# Patient Record
Sex: Female | Born: 1940 | Race: White | Hispanic: No | Marital: Married | Smoking: Current every day smoker
Health system: Southern US, Community
[De-identification: ages and names within clinical notes are randomized; demographics above are authoritative.]

## PROBLEM LIST (undated history)

## (undated) DIAGNOSIS — F419 Anxiety disorder, unspecified: Secondary | ICD-10-CM

## (undated) DIAGNOSIS — J449 Chronic obstructive pulmonary disease, unspecified: Secondary | ICD-10-CM

## (undated) DIAGNOSIS — R739 Hyperglycemia, unspecified: Secondary | ICD-10-CM

## (undated) DIAGNOSIS — F329 Major depressive disorder, single episode, unspecified: Secondary | ICD-10-CM

## (undated) DIAGNOSIS — E785 Hyperlipidemia, unspecified: Secondary | ICD-10-CM

## (undated) DIAGNOSIS — M5136 Other intervertebral disc degeneration, lumbar region: Secondary | ICD-10-CM

## (undated) DIAGNOSIS — M51369 Other intervertebral disc degeneration, lumbar region without mention of lumbar back pain or lower extremity pain: Secondary | ICD-10-CM

## (undated) DIAGNOSIS — T7840XA Allergy, unspecified, initial encounter: Secondary | ICD-10-CM

## (undated) DIAGNOSIS — K219 Gastro-esophageal reflux disease without esophagitis: Secondary | ICD-10-CM

## (undated) DIAGNOSIS — I10 Essential (primary) hypertension: Secondary | ICD-10-CM

## (undated) DIAGNOSIS — F32A Depression, unspecified: Secondary | ICD-10-CM

## (undated) HISTORY — DX: Other intervertebral disc degeneration, lumbar region: M51.36

## (undated) HISTORY — DX: Allergy, unspecified, initial encounter: T78.40XA

## (undated) HISTORY — DX: Gastro-esophageal reflux disease without esophagitis: K21.9

## (undated) HISTORY — DX: Anxiety disorder, unspecified: F41.9

## (undated) HISTORY — DX: Depression, unspecified: F32.A

## (undated) HISTORY — DX: Hyperlipidemia, unspecified: E78.5

## (undated) HISTORY — DX: Major depressive disorder, single episode, unspecified: F32.9

## (undated) HISTORY — PX: CATARACT EXTRACTION: SUR2

## (undated) HISTORY — DX: Hyperglycemia, unspecified: R73.9

## (undated) HISTORY — DX: Essential (primary) hypertension: I10

## (undated) HISTORY — DX: Other intervertebral disc degeneration, lumbar region without mention of lumbar back pain or lower extremity pain: M51.369

---

## 2001-04-16 ENCOUNTER — Encounter (INDEPENDENT_AMBULATORY_CARE_PROVIDER_SITE_OTHER): Payer: Self-pay | Admitting: Specialist

## 2001-04-16 ENCOUNTER — Other Ambulatory Visit: Admission: RE | Admit: 2001-04-16 | Discharge: 2001-04-16 | Payer: Self-pay | Admitting: Obstetrics and Gynecology

## 2002-08-19 ENCOUNTER — Encounter: Admission: RE | Admit: 2002-08-19 | Discharge: 2002-08-19 | Payer: Self-pay | Admitting: Orthopedic Surgery

## 2002-08-19 ENCOUNTER — Encounter: Payer: Self-pay | Admitting: Orthopedic Surgery

## 2002-09-03 ENCOUNTER — Encounter: Payer: Self-pay | Admitting: Orthopedic Surgery

## 2002-09-03 ENCOUNTER — Encounter: Admission: RE | Admit: 2002-09-03 | Discharge: 2002-09-03 | Payer: Self-pay | Admitting: Orthopedic Surgery

## 2003-10-26 ENCOUNTER — Encounter: Payer: Self-pay | Admitting: Family Medicine

## 2003-10-26 LAB — CONVERTED CEMR LAB

## 2005-03-14 ENCOUNTER — Ambulatory Visit: Payer: Self-pay | Admitting: Family Medicine

## 2005-11-14 ENCOUNTER — Ambulatory Visit: Payer: Self-pay | Admitting: Family Medicine

## 2005-11-21 ENCOUNTER — Ambulatory Visit: Payer: Self-pay | Admitting: Professional

## 2005-11-28 ENCOUNTER — Ambulatory Visit: Payer: Self-pay | Admitting: Professional

## 2005-12-12 ENCOUNTER — Ambulatory Visit: Payer: Self-pay | Admitting: Family Medicine

## 2006-02-21 ENCOUNTER — Ambulatory Visit: Payer: Self-pay | Admitting: Family Medicine

## 2006-02-28 ENCOUNTER — Ambulatory Visit: Payer: Self-pay | Admitting: Family Medicine

## 2006-05-29 ENCOUNTER — Ambulatory Visit: Payer: Self-pay | Admitting: Family Medicine

## 2006-05-29 LAB — CONVERTED CEMR LAB: Hgb A1c MFr Bld: 5.8 %

## 2006-09-06 ENCOUNTER — Ambulatory Visit: Payer: Self-pay | Admitting: Family Medicine

## 2006-11-02 ENCOUNTER — Ambulatory Visit: Payer: Self-pay | Admitting: Family Medicine

## 2007-02-21 ENCOUNTER — Ambulatory Visit: Payer: Self-pay | Admitting: Family Medicine

## 2007-04-10 ENCOUNTER — Encounter: Payer: Self-pay | Admitting: Family Medicine

## 2007-04-10 DIAGNOSIS — K219 Gastro-esophageal reflux disease without esophagitis: Secondary | ICD-10-CM

## 2007-04-10 DIAGNOSIS — F411 Generalized anxiety disorder: Secondary | ICD-10-CM

## 2007-04-10 DIAGNOSIS — F329 Major depressive disorder, single episode, unspecified: Secondary | ICD-10-CM

## 2007-04-10 DIAGNOSIS — F17200 Nicotine dependence, unspecified, uncomplicated: Secondary | ICD-10-CM | POA: Insufficient documentation

## 2007-04-10 DIAGNOSIS — I10 Essential (primary) hypertension: Secondary | ICD-10-CM

## 2007-04-10 DIAGNOSIS — E785 Hyperlipidemia, unspecified: Secondary | ICD-10-CM | POA: Insufficient documentation

## 2007-04-10 DIAGNOSIS — M5137 Other intervertebral disc degeneration, lumbosacral region: Secondary | ICD-10-CM

## 2007-04-23 ENCOUNTER — Ambulatory Visit: Payer: Self-pay | Admitting: Family Medicine

## 2007-04-25 LAB — CONVERTED CEMR LAB
ALT: 19 units/L (ref 0–40)
AST: 22 units/L (ref 0–37)
Albumin: 3.6 g/dL (ref 3.5–5.2)
BUN: 12 mg/dL (ref 6–23)
CO2: 32 meq/L (ref 19–32)
Calcium: 9 mg/dL (ref 8.4–10.5)
Chloride: 103 meq/L (ref 96–112)
Cholesterol: 212 mg/dL (ref 0–200)
Creatinine, Ser: 0.9 mg/dL (ref 0.4–1.2)
Direct LDL: 142.7 mg/dL
GFR calc Af Amer: 80 mL/min
GFR calc non Af Amer: 66 mL/min
Glucose, Bld: 99 mg/dL (ref 70–99)
HCT: 44 % (ref 36.0–46.0)
HDL: 39.7 mg/dL (ref >39.0)
Hemoglobin: 14.4 g/dL (ref 12.0–15.0)
Hgb A1c MFr Bld: 6.3 % — ABNORMAL HIGH (ref 4.6–6.0)
MCHC: 32.8 g/dL (ref 30.0–36.0)
MCV: 84.6 fL (ref 78.0–100.0)
Phosphorus: 3.4 mg/dL (ref 2.3–4.6)
Platelets: 272 10*3/uL (ref 150–400)
Potassium: 4.1 meq/L (ref 3.5–5.1)
RBC: 5.21 M/uL — ABNORMAL HIGH (ref 3.87–5.11)
RDW: 14.2 % (ref 11.5–14.6)
Sodium: 139 meq/L (ref 135–145)
Total CHOL/HDL Ratio: 5.3
Triglycerides: 105 mg/dL (ref 0–149)
VLDL: 21 mg/dL (ref 0–40)
WBC: 6.8 10*3/uL (ref 4.5–10.5)

## 2007-05-14 ENCOUNTER — Ambulatory Visit: Payer: Self-pay | Admitting: Family Medicine

## 2007-06-13 ENCOUNTER — Encounter: Payer: Self-pay | Admitting: Family Medicine

## 2007-07-31 ENCOUNTER — Ambulatory Visit: Payer: Self-pay | Admitting: Family Medicine

## 2007-08-02 LAB — CONVERTED CEMR LAB
ALT: 18 units/L (ref 0–35)
AST: 21 units/L (ref 0–37)
Cholesterol: 176 mg/dL (ref 0–200)
Creatinine,U: 82.9 mg/dL
HDL: 35.8 mg/dL — ABNORMAL LOW (ref >39.0)
Hgb A1c MFr Bld: 6 % (ref 4.6–6.0)
LDL Cholesterol: 125 mg/dL — ABNORMAL HIGH (ref 0–99)
Microalb Creat Ratio: 2.4 mg/g (ref 0.0–30.0)
Microalb, Ur: 0.2 mg/dL (ref 0.0–1.9)
Total CHOL/HDL Ratio: 4.9
Triglycerides: 77 mg/dL (ref 0–149)
VLDL: 15 mg/dL (ref 0–40)

## 2007-09-20 ENCOUNTER — Ambulatory Visit: Payer: Self-pay | Admitting: Family Medicine

## 2007-09-24 ENCOUNTER — Ambulatory Visit: Payer: Self-pay | Admitting: Family Medicine

## 2007-10-28 ENCOUNTER — Ambulatory Visit: Payer: Self-pay | Admitting: Family Medicine

## 2007-11-08 ENCOUNTER — Ambulatory Visit: Payer: Self-pay | Admitting: Family Medicine

## 2008-01-07 ENCOUNTER — Ambulatory Visit: Payer: Self-pay | Admitting: Family Medicine

## 2008-01-08 ENCOUNTER — Telehealth: Payer: Self-pay | Admitting: Family Medicine

## 2008-01-13 LAB — CONVERTED CEMR LAB
ALT: 17 units/L (ref 0–35)
AST: 21 units/L (ref 0–37)
Albumin: 3.6 g/dL (ref 3.5–5.2)
BUN: 13 mg/dL (ref 6–23)
CO2: 33 meq/L — ABNORMAL HIGH (ref 19–32)
Calcium: 9.1 mg/dL (ref 8.4–10.5)
Chloride: 100 meq/L (ref 96–112)
Cholesterol: 197 mg/dL (ref 0–200)
Creatinine, Ser: 0.9 mg/dL (ref 0.4–1.2)
GFR calc Af Amer: 80 mL/min
GFR calc non Af Amer: 66 mL/min
Glucose, Bld: 112 mg/dL — ABNORMAL HIGH (ref 70–99)
HDL: 39 mg/dL (ref >39.0)
Hgb A1c MFr Bld: 6.3 % — ABNORMAL HIGH (ref 4.6–6.0)
LDL Cholesterol: 143 mg/dL — ABNORMAL HIGH (ref 0–99)
Phosphorus: 3.5 mg/dL (ref 2.3–4.6)
Potassium: 4.1 meq/L (ref 3.5–5.1)
Sodium: 139 meq/L (ref 135–145)
Total CHOL/HDL Ratio: 5.1
Triglycerides: 75 mg/dL (ref 0–149)
VLDL: 15 mg/dL (ref 0–40)

## 2008-05-07 ENCOUNTER — Ambulatory Visit: Payer: Self-pay | Admitting: Family Medicine

## 2008-10-12 ENCOUNTER — Telehealth: Payer: Self-pay | Admitting: Family Medicine

## 2008-10-21 ENCOUNTER — Ambulatory Visit: Payer: Self-pay | Admitting: Family Medicine

## 2008-10-22 LAB — CONVERTED CEMR LAB
ALT: 11 units/L (ref 0–35)
AST: 17 units/L (ref 0–37)
Albumin: 3.7 g/dL (ref 3.5–5.2)
Alkaline Phosphatase: 51 units/L (ref 39–117)
BUN: 11 mg/dL (ref 6–23)
Basophils Absolute: 0 10*3/uL (ref 0.0–0.1)
Basophils Relative: 0.2 % (ref 0.0–3.0)
Bilirubin, Direct: 0.1 mg/dL (ref 0.0–0.3)
CO2: 30 meq/L (ref 19–32)
Calcium: 8.7 mg/dL (ref 8.4–10.5)
Chloride: 100 meq/L (ref 96–112)
Cholesterol: 182 mg/dL (ref 0–200)
Creatinine, Ser: 0.8 mg/dL (ref 0.4–1.2)
Eosinophils Absolute: 0 10*3/uL (ref 0.0–0.7)
Eosinophils Relative: 0.2 % (ref 0.0–5.0)
GFR calc Af Amer: 92 mL/min
GFR calc non Af Amer: 76 mL/min
Glucose, Bld: 119 mg/dL — ABNORMAL HIGH (ref 70–99)
HCT: 43.9 % (ref 36.0–46.0)
HDL: 37.1 mg/dL — ABNORMAL LOW (ref >39.0)
Hemoglobin: 14.9 g/dL (ref 12.0–15.0)
Hgb A1c MFr Bld: 6.3 % — ABNORMAL HIGH (ref 4.6–6.0)
LDL Cholesterol: 123 mg/dL — ABNORMAL HIGH (ref 0–99)
Lymphocytes Relative: 13.3 % (ref 12.0–46.0)
MCHC: 34 g/dL (ref 30.0–36.0)
MCV: 85.9 fL (ref 78.0–100.0)
Monocytes Absolute: 0.2 10*3/uL (ref 0.1–1.0)
Monocytes Relative: 2.5 % — ABNORMAL LOW (ref 3.0–12.0)
Neutro Abs: 8.5 10*3/uL — ABNORMAL HIGH (ref 1.4–7.7)
Neutrophils Relative %: 83.8 % — ABNORMAL HIGH (ref 43.0–77.0)
Phosphorus: 3.1 mg/dL (ref 2.3–4.6)
Platelets: 265 10*3/uL (ref 150–400)
Potassium: 3.5 meq/L (ref 3.5–5.1)
RBC: 5.11 M/uL (ref 3.87–5.11)
RDW: 14.3 % (ref 11.5–14.6)
Sodium: 137 meq/L (ref 135–145)
TSH: 0.72 microintl units/mL (ref 0.35–5.50)
Total Bilirubin: 0.7 mg/dL (ref 0.3–1.2)
Total CHOL/HDL Ratio: 4.9
Total Protein: 6.9 g/dL (ref 6.0–8.3)
Triglycerides: 109 mg/dL (ref 0–149)
VLDL: 22 mg/dL (ref 0–40)
WBC: 10 10*3/uL (ref 4.5–10.5)

## 2008-10-30 ENCOUNTER — Telehealth (INDEPENDENT_AMBULATORY_CARE_PROVIDER_SITE_OTHER): Payer: Self-pay | Admitting: *Deleted

## 2008-11-11 ENCOUNTER — Telehealth: Payer: Self-pay | Admitting: Family Medicine

## 2008-11-12 ENCOUNTER — Telehealth: Payer: Self-pay | Admitting: Family Medicine

## 2008-12-01 ENCOUNTER — Ambulatory Visit: Payer: Self-pay | Admitting: Family Medicine

## 2009-03-03 ENCOUNTER — Ambulatory Visit: Payer: Self-pay | Admitting: Family Medicine

## 2009-03-04 LAB — CONVERTED CEMR LAB
ALT: 13 units/L (ref 0–35)
AST: 15 units/L (ref 0–37)
Albumin: 3.3 g/dL — ABNORMAL LOW (ref 3.5–5.2)
BUN: 15 mg/dL (ref 6–23)
CO2: 31 meq/L (ref 19–32)
Calcium: 8.9 mg/dL (ref 8.4–10.5)
Chloride: 101 meq/L (ref 96–112)
Cholesterol: 215 mg/dL (ref 0–200)
Creatinine, Ser: 0.9 mg/dL (ref 0.4–1.2)
Direct LDL: 164 mg/dL
GFR calc Af Amer: 80 mL/min
GFR calc non Af Amer: 66 mL/min
Glucose, Bld: 99 mg/dL (ref 70–99)
HDL: 33.2 mg/dL — ABNORMAL LOW (ref >39.0)
Hgb A1c MFr Bld: 6.4 % — ABNORMAL HIGH (ref 4.6–6.0)
Phosphorus: 3.3 mg/dL (ref 2.3–4.6)
Potassium: 4.2 meq/L (ref 3.5–5.1)
Sodium: 138 meq/L (ref 135–145)
Total CHOL/HDL Ratio: 6.5
Triglycerides: 130 mg/dL (ref 0–149)
VLDL: 26 mg/dL (ref 0–40)

## 2009-03-10 ENCOUNTER — Ambulatory Visit: Payer: Self-pay | Admitting: Family Medicine

## 2009-09-23 ENCOUNTER — Ambulatory Visit: Payer: Self-pay | Admitting: Family Medicine

## 2009-10-12 ENCOUNTER — Ambulatory Visit: Payer: Self-pay | Admitting: Family Medicine

## 2009-10-15 LAB — CONVERTED CEMR LAB
ALT: 15 units/L (ref 0–35)
AST: 19 units/L (ref 0–37)
Cholesterol: 204 mg/dL — ABNORMAL HIGH (ref 0–200)
Direct LDL: 152.8 mg/dL
HDL: 32.7 mg/dL — ABNORMAL LOW (ref >39.00)
Hgb A1c MFr Bld: 6.3 % (ref 4.6–6.5)
Total CHOL/HDL Ratio: 6
Triglycerides: 133 mg/dL (ref 0.0–149.0)
VLDL: 26.6 mg/dL (ref 0.0–40.0)

## 2009-11-30 ENCOUNTER — Ambulatory Visit: Payer: Self-pay | Admitting: Family Medicine

## 2009-11-30 LAB — CONVERTED CEMR LAB: AST: 21 units/L (ref 0–37)

## 2009-12-02 LAB — CONVERTED CEMR LAB
Cholesterol: 141 mg/dL (ref 0–200)
LDL Cholesterol: 85 mg/dL (ref 0–99)
Triglycerides: 94 mg/dL (ref 0.0–149.0)

## 2009-12-13 ENCOUNTER — Telehealth: Payer: Self-pay | Admitting: Family Medicine

## 2010-01-19 ENCOUNTER — Ambulatory Visit: Payer: Self-pay | Admitting: Family Medicine

## 2010-02-09 ENCOUNTER — Telehealth: Payer: Self-pay | Admitting: Family Medicine

## 2010-02-09 ENCOUNTER — Encounter: Payer: Self-pay | Admitting: Family Medicine

## 2010-03-23 ENCOUNTER — Ambulatory Visit: Payer: Self-pay | Admitting: Family Medicine

## 2010-03-24 LAB — CONVERTED CEMR LAB
AST: 19 units/L (ref 0–37)
Cholesterol: 128 mg/dL (ref 0–200)
HDL: 42.1 mg/dL (ref >39.00)
LDL Cholesterol: 63 mg/dL (ref 0–99)
Triglycerides: 117 mg/dL (ref 0.0–149.0)
VLDL: 23.4 mg/dL (ref 0.0–40.0)

## 2010-04-06 ENCOUNTER — Telehealth: Payer: Self-pay | Admitting: Family Medicine

## 2010-08-23 ENCOUNTER — Ambulatory Visit: Payer: Self-pay | Admitting: Family Medicine

## 2010-08-23 DIAGNOSIS — J309 Allergic rhinitis, unspecified: Secondary | ICD-10-CM | POA: Insufficient documentation

## 2010-08-23 DIAGNOSIS — R739 Hyperglycemia, unspecified: Secondary | ICD-10-CM | POA: Insufficient documentation

## 2010-08-24 LAB — CONVERTED CEMR LAB
ALT: 12 units/L (ref 0–35)
BUN: 19 mg/dL (ref 6–23)
Basophils Absolute: 0.1 10*3/uL (ref 0.0–0.1)
Basophils Relative: 0.5 % (ref 0.0–3.0)
Creatinine, Ser: 0.8 mg/dL (ref 0.4–1.2)
Eosinophils Absolute: 0.3 10*3/uL (ref 0.0–0.7)
GFR calc non Af Amer: 77.5 mL/min (ref >60)
Glucose, Bld: 97 mg/dL (ref 70–99)
HDL: 34.5 mg/dL — ABNORMAL LOW (ref >39.00)
Hgb A1c MFr Bld: 6.7 % — ABNORMAL HIGH (ref 4.6–6.5)
LDL Cholesterol: 100 mg/dL — ABNORMAL HIGH (ref 0–99)
Lymphocytes Relative: 23.4 % (ref 12.0–46.0)
MCHC: 33 g/dL (ref 30.0–36.0)
MCV: 85.4 fL (ref 78.0–100.0)
Monocytes Absolute: 0.7 10*3/uL (ref 0.1–1.0)
Neutro Abs: 6.6 10*3/uL (ref 1.4–7.7)
Neutrophils Relative %: 66.3 % (ref 43.0–77.0)
RBC: 5.08 M/uL (ref 3.87–5.11)
RDW: 15.8 % — ABNORMAL HIGH (ref 11.5–14.6)
Sodium: 139 meq/L (ref 135–145)
Total CHOL/HDL Ratio: 4
Triglycerides: 80 mg/dL (ref 0.0–149.0)
VLDL: 16 mg/dL (ref 0.0–40.0)

## 2010-09-13 ENCOUNTER — Telehealth: Payer: Self-pay | Admitting: Family Medicine

## 2010-10-12 ENCOUNTER — Telehealth: Payer: Self-pay | Admitting: Family Medicine

## 2010-11-23 ENCOUNTER — Telehealth: Payer: Self-pay | Admitting: Family Medicine

## 2010-12-06 ENCOUNTER — Ambulatory Visit: Payer: Self-pay | Admitting: Family Medicine

## 2010-12-06 DIAGNOSIS — M25519 Pain in unspecified shoulder: Secondary | ICD-10-CM

## 2010-12-13 ENCOUNTER — Telehealth: Payer: Self-pay | Admitting: Family Medicine

## 2011-01-12 ENCOUNTER — Ambulatory Visit
Admission: RE | Admit: 2011-01-12 | Discharge: 2011-01-12 | Payer: Self-pay | Source: Home / Self Care | Attending: Family Medicine | Admitting: Family Medicine

## 2011-01-12 DIAGNOSIS — M7500 Adhesive capsulitis of unspecified shoulder: Secondary | ICD-10-CM | POA: Insufficient documentation

## 2011-01-24 NOTE — Progress Notes (Signed)
Summary: refill request for xanax  Phone Note Refill Request Call back at [phone] Message from:  Patient  Refills Requested: Medication #1:  ALPRAZOLAM 0.5 MG TABS 1 by mouth once daily as needed severe anxiety Phoned request from pt, please call to [facility] [address].  Initial call taken by: Lowella Petties CMA,  April 06, 2010 4:13 PM  Follow-up for Phone Call        px written on EMR for call in  Follow-up by: Judith Part MD,  April 06, 2010 4:53 PM  Additional Follow-up for Phone Call Additional follow up Details #1::        Patient's hsuband notified as instructed by telephone.  Medication phoned to [facility] as instructed. Lewanda Rife LPN  April 06, 2010 5:03 PM     New/Updated Medications: ALPRAZOLAM 0.5 MG TABS (ALPRAZOLAM) 1 by mouth once daily as needed severe anxiety Prescriptions: ALPRAZOLAM 0.5 MG TABS (ALPRAZOLAM) 1 by mouth once daily as needed severe anxiety  #30 x 0   Entered and Authorized by:   Judith Part MD   Signed by:   Lewanda Rife LPN on 45/40/9811   Method used:   Telephoned to ...         RxID:   [id number]

## 2011-01-24 NOTE — Miscellaneous (Signed)
Summary: Controlled Substance Agreement  Controlled Substance Agreement   Imported By: Lanelle Bal 09/02/2010 13:45:25  _____________________________________________________________________  External Attachment:    Type:   Image     Comment:   External Document

## 2011-01-24 NOTE — Progress Notes (Signed)
Summary: alprazolam   Phone Note Refill Request Message from:  Fax from Pharmacy on November 23, 2010 10:33 AM  Refills Requested: Medication #1:  ALPRAZOLAM 0.5 MG TABS 1 by mouth once daily as needed severe anxiety Refill request from [facility] [address]. [phone].   Initial call taken by: Melody Comas,  November 23, 2010 10:34 AM  Follow-up for Phone Call        px written on EMR for call in  Follow-up by: Judith Part MD,  November 23, 2010 12:56 PM  Additional Follow-up for Phone Call Additional follow up Details #1::        Rx called in as directed Additional Follow-up by: Janee Morn CMA Duncan Dull),  November 23, 2010 2:13 PM    Prescriptions: ALPRAZOLAM 0.5 MG TABS (ALPRAZOLAM) 1 by mouth once daily as needed severe anxiety  #30 x 1   Entered and Authorized by:   Judith Part MD   Signed by:   Selena Batten Dance CMA (AAMA) on 11/23/2010   Method used:   Telephoned to ...       [facility]       [address]       [place], [place]  [address]       Ph: [phone]       Fax: [phone]   RxID:   [id number]

## 2011-01-24 NOTE — Progress Notes (Signed)
Summary: form for diabetic supplies  Phone Note From Pharmacy   Caller: [facility] Summary of Call: Form for diabetic supplies is on your shelf. Initial call taken by: Lowella Petties CMA,  February 09, 2010 8:29 AM  Follow-up for Phone Call        form done and in nurse in box  Follow-up by: Judith Part MD,  February 09, 2010 6:05 PM  Additional Follow-up for Phone Call Additional follow up Details #1::        Completed form faxed to [phone] as instructed.Lewanda Rife LPN  February 10, 2010 9:12 AM

## 2011-01-24 NOTE — Assessment & Plan Note (Signed)
Summary: 3 months follow up /lsf   Vital Signs:  Patient profile:   70 year old female Weight:      141 pounds BMI:     23.19 Temp:     97.5 degrees F oral Pulse rate:   72 / minute Pulse rhythm:   regular BP sitting:   130 / 62  (left arm) Cuff size:   regular  Vitals Entered By: Lowella Petties CMA (January 19, 2010 1:59 PM) CC: 3 month follow up   History of Present Illness: here for f/u of HTN and lipids and DM  is doing ok overall  no luck on the job search- really wants to go back to work    wt is up 2 lb with bmi of 23  bp 130/62 today and has been well controlled   last lipids -- LDL 85 in december on zocor  is really pleased with that  is doing well with her diet low sugar and low fat   is doing some exercise at home -- some calesthenics    sugar control was good in oct 6.3 AIC  opthy -- is up to date - went 3 months ago - was ok -- dr Abran Duke  has f/u in 6 mo (no DM eye disease)  anxiety is overall much better - stable on her current medicine -- does not use alprazolam  often  still takes paxil - and that works welll   smoking 6 cigarettes per day- wants to start patch  Allergies: No Known Drug Allergies  Past History:  Past Medical History: Last updated: 10/21/2008  HYPERTENSION, BENIGN (ICD-401.1) ADVEF, DRUG/MEDICINAL/BIOLOGICAL SUBST NOS (ICD-995.20) BREAST CANCER, FAMILY HX (ICD-V16.3) Hx of DEGENERATIVE DISC DISEASE, LUMBOSACRAL SPINE (ICD-722.52) Hx of SMOKER (ICD-305.1) HYPERTENSION (ICD-401.9) HYPERLIPIDEMIA (ICD-272.4) GERD (ICD-530.81) DIABETES MELLITUS, TYPE II (ICD-250.00) DEPRESSION (ICD-311) ANXIETY (ICD-300.00)    Past Surgical History: Last updated: 04/10/2007 cataract sx  Family History: Last updated: 04/10/2007 mother breast ca sister breast ca sister lung ca  Social History: Last updated: 10/21/2008 Patient currently smokes.  no alcohol lost job 2009  Risk Factors: Smoking Status: current  (04/10/2007)  Review of Systems General:  Denies fatigue, fever, loss of appetite, and malaise. Eyes:  Denies blurring and eye irritation. CV:  Denies chest pain or discomfort, lightheadness, palpitations, and shortness of breath with exertion. Resp:  Denies cough, shortness of breath, and wheezing. GI:  Denies abdominal pain, change in bowel habits, and indigestion. MS:  Denies joint pain. Derm:  Denies lesion(s), poor wound healing, and rash. Neuro:  Denies headaches, numbness, and tingling. Psych:  Denies panic attacks, sense of great danger, and suicidal thoughts/plans. Endo:  Denies excessive thirst and excessive urination. Heme:  Denies abnormal bruising and bleeding.  Physical Exam  General:  Well-developed,well-nourished,in no acute distress; alert,appropriate and cooperative throughout examination Head:  normocephalic, atraumatic, and no abnormalities observed.   Eyes:  vision grossly intact, pupils equal, pupils round, and pupils reactive to light.   Mouth:  pharynx pink and moist.   Neck:  supple with full rom and no masses or thyromegally, no JVD or carotid bruit  Lungs:  CTA with distant bs harsh at bases- few isolated rhonchi no rales or wheeze nl exp phase Heart:  Normal rate and regular rhythm. S1 and S2 normal without gallop, murmur, click, rub or other extra sounds. Pulses:  R and L carotid,radial,femoral,dorsalis pedis and posterior tibial pulses are full and equal bilaterally Extremities:  No clubbing, cyanosis, edema, or deformity noted with  normal full range of motion of all joints.   Neurologic:  sensation intact to light touch, gait normal, and DTRs symmetrical and normal.   Skin:  Intact without suspicious lesions or rashes Cervical Nodes:  No lymphadenopathy noted Psych:  normal affect, talkative and pleasant  not anxious  Diabetes Management Exam:    Foot Exam (with socks and/or shoes not present):       Sensory-Pinprick/Light touch:          Left  medial foot (L-4): normal          Left dorsal foot (L-5): normal          Left lateral foot (S-1): normal          Right medial foot (L-4): normal          Right dorsal foot (L-5): normal          Right lateral foot (S-1): normal       Sensory-Monofilament:          Left foot: normal          Right foot: normal       Inspection:          Left foot: normal          Right foot: normal       Nails:          Left foot: normal          Right foot: normal    Eye Exam:       Eye Exam done elsewhere          Date: 09/24/2009          Results: normal          Done by: Dr Hazle Quant   Impression & Recommendations:  Problem # 1:  HYPERTENSION, BENIGN (ICD-401.1) Assessment Unchanged  good control without change enc starting 20 min exercise daily f/u 6 mo  Her updated medication list for this problem includes:    Zestril 10 Mg Tabs (Lisinopril) .Marland Kitchen... 1 by mouth once daily  BP today: 130/62 Prior BP: 90/58 (09/23/2009)  Labs Reviewed: K+: 4.2 (03/03/2009) Creat: : 0.9 (03/03/2009)   Chol: 141 (11/30/2009)   HDL: 37.70 (11/30/2009)   LDL: 85 (11/30/2009)   TG: 94.0 (11/30/2009)  Problem # 2:  Hx of SMOKER (ICD-305.1) Assessment: Improved encouraged to move foward with plan to quit / patches  has cut down a lot also - is motivated   Problem # 3:  HYPERLIPIDEMIA (ICD-272.4) Assessment: Improved  improved but not to goal of LDL under 70 increase zocor to 40 - update if side eff lab 6 weeks f/u 6 mo  rev low sat fat diet  Her updated medication list for this problem includes:    Zocor 40 Mg Tabs (Simvastatin) .Marland Kitchen... 1 by mouth once daily  Labs Reviewed: SGOT: 21 (11/30/2009)   SGPT: 13 (11/30/2009)   HDL:37.70 (11/30/2009), 32.70 (10/12/2009)  LDL:85 (11/30/2009), DEL (16/09/9603)  Chol:141 (11/30/2009), 204 (10/12/2009)  Trig:94.0 (11/30/2009), 133.0 (10/12/2009)  Problem # 4:  DIABETES MELLITUS, TYPE II (ICD-250.00) Assessment: Unchanged  AIC has been ok with diet control  alone disc healthy diet (low simple sugar/ choose complex carbs/ low sat fat) diet and exercise in detail  up to date opthy and workign on smoking cessation will checkAIC in 6 wk and f/u 6 mo  Her updated medication list for this problem includes:    Zestril 10 Mg Tabs (Lisinopril) .Marland Kitchen... 1 by mouth once daily  Aspirin Adult Low Strength 81 Mg Tbec (Aspirin) .Marland Kitchen... 1 by mouth once daily  Labs Reviewed: Creat: 0.9 (03/03/2009)     Last Eye Exam: normal (09/24/2008) Reviewed HgBA1c results: 6.3 (10/12/2009)  6.4 (03/03/2009)  Problem # 5:  ANXIETY (ICD-300.00) Assessment: Unchanged stable on current med without change  recommended exercise regularly The following medications were removed from the medication list:    Paxil 40 Mg Tabs (Paroxetine hcl) .Marland Kitchen... 1 by mouth once daily Her updated medication list for this problem includes:    Alprazolam 0.5 Mg Tabs (Alprazolam) .Marland Kitchen... 1 by mouth once daily as needed severe anxiety    Paxil 40 Mg Tabs (Paroxetine hcl) .Marland Kitchen... 1 by mouth once daily  Complete Medication List: 1)  Glucometer  .... To check glucose once daily and as needed for diabetes (250.0) 2)  Glucose Test Strips and Lancets  .... To check glucose once daily and as needed for diabetes type 2 3)  Zestril 10 Mg Tabs (Lisinopril) .Marland Kitchen.. 1 by mouth once daily 4)  Alprazolam 0.5 Mg Tabs (Alprazolam) .Marland Kitchen.. 1 by mouth once daily as needed severe anxiety 5)  Zocor 40 Mg Tabs (Simvastatin) .Marland Kitchen.. 1 by mouth once daily 6)  Aspirin Adult Low Strength 81 Mg Tbec (Aspirin) .Marland Kitchen.. 1 by mouth once daily 7)  Paxil 40 Mg Tabs (Paroxetine hcl) .Marland Kitchen.. 1 by mouth once daily  Patient Instructions: 1)  It is important that you exercise reguarly at least 20 minutes 5 times a week. If you develop chest pain, have severe difficulty breathing, or feel very tired, stop exercising immediately and seek medical attention.  2)  try exercise video or walking around house or mall  3)  increase your zocor to 40 mg daily  (double up on the 20s until you get new px)  4)  schedule fasting lab in 6 weeks lipid/ast/alt 272, AIC 250.0. 272 5)  schedule follow up with me in about 6 months  Prescriptions: ZOCOR 40 MG TABS (SIMVASTATIN) 1 by mouth once daily  #30 x 11   Entered and Authorized by:   Judith Part MD   Signed by:   Judith Part MD on 01/19/2010   Method used:   Print then Give to Patient   RxID:   [id number]   Prior Medications (reviewed today): GLUCOMETER () to check glucose once daily and as needed for diabetes (250.0) GLUCOSE TEST STRIPS AND LANCETS () to check glucose once daily and as needed for diabetes type 2 ZESTRIL 10 MG TABS (LISINOPRIL) 1 by mouth once daily ALPRAZOLAM 0.5 MG TABS (ALPRAZOLAM) 1 by mouth once daily as needed severe anxiety ASPIRIN ADULT LOW STRENGTH 81 MG TBEC (ASPIRIN) 1 by mouth once daily Current Allergies: No known allergies

## 2011-01-24 NOTE — Progress Notes (Signed)
Summary: refill request for alprazolam  Phone Note Refill Request Call back at Home Phone [phone] Message from:  Patient  Refills Requested: Medication #1:  ALPRAZOLAM 0.5 MG TABS 1 by mouth once daily as needed severe anxiety Pt uses [facility] [address].  She is also complaining of nausea with headaches and she is asking if she can have some phenergan.  No vomiting, just occasional nausea with headaches and stress.  Initial call taken by: Lowella Petties CMA,  October 12, 2010 10:01 AM  Follow-up for Phone Call        px written on EMR for call in f/u if other symptoms worsen Follow-up by: Judith Part MD,  October 12, 2010 10:14 AM  Additional Follow-up for Phone Call Additional follow up Details #1::        Patient notified as instructed by telephone. [facility] [address] [facility] as instructed. Lewanda Rife LPN  October 12, 2010 10:59 AM     New/Updated Medications: PROMETHAZINE HCL 25 MG TABS (PROMETHAZINE HCL) 1 by mouth up to three times a day as needed nausea caution may sedate Prescriptions: PROMETHAZINE HCL 25 MG TABS (PROMETHAZINE HCL) 1 by mouth up to three times a day as needed nausea caution may sedate  #20 x 0   Entered and Authorized by:   Judith Part MD   Signed by:   Lewanda Rife LPN on 09/81/1914   Method used:   Telephoned to ...         RxID:   [id number] ALPRAZOLAM 0.5 MG TABS (ALPRAZOLAM) 1 by mouth once daily as needed severe anxiety  #30 x 0   Entered and Authorized by:   [place]ith Part MD   Signed by:   Lewanda Rife LPN on 69/62/9528   Method used:   Telephoned to ...         RxID:   [id number]

## 2011-01-24 NOTE — Progress Notes (Signed)
Summary: refill request for alprazolam  Phone Note Refill Request Message from:  Fax from Pharmacy  Refills Requested: Medication #1:  ALPRAZOLAM 0.5 MG TABS 1 by mouth once daily as needed severe anxiety   Last Refilled: 04/06/2010 Faxed request from [facility] [address], [phone].  Initial call taken by: Lowella Petties CMA,  September 13, 2010 10:39 AM  Follow-up for Phone Call        px written on EMR for call in  Follow-up by: Judith Part MD,  September 13, 2010 12:47 PM  Additional Follow-up for Phone Call Additional follow up Details #1::        [facility] as instructed. Lewanda Rife LPN  September 13, 2010 12:56 PM     Prescriptions: ALPRAZOLAM 0.5 MG TABS (ALPRAZOLAM) 1 by mouth once daily as needed severe anxiety  #30 x 0   Entered and Authorized by:   Judith Part MD   Signed by:   Lewanda Rife LPN on 66/44/0347   Method used:   Telephoned to ...         RxID:   [id number]

## 2011-01-24 NOTE — Medication Information (Signed)
Summary: Diabetes Supplies/[facility]  Diabetes Supplies/[facility]   Imported By: Lanelle Bal 02/12/2010 11:48:57  _____________________________________________________________________  External Attachment:    Type:   Image     Comment:   External Document

## 2011-01-24 NOTE — Assessment & Plan Note (Signed)
Summary: F/U DLO   Vital Signs:  Patient profile:   70 year old female Height:      65.5 inches Weight:      139.75 pounds BMI:     22.98 Temp:     98.8 degrees F oral Pulse rate:   88 / minute Pulse rhythm:   regular BP sitting:   104 / 64  (left arm) Cuff size:   regular  Vitals Entered By: Lewanda Rife LPN (August 23, 2010 9:15 AM) CC: follow up visit   History of Present Illness: here for f/u of HTN and DM and smoking and anxiety  has been doing well overall -- some sinus congestion  takes tylenol over the counter  stuffy at night    last lipids in spring improved with inc zocor to 40 mg   DM was good last check 6.3 AIC - actually qualifies for hyperglycemia  this is diet controlled  opthy utd  on ace  is still doing well with her diet  some exercise several days per week  has been putting up garden veg and canning   smoking -- still smoking  was going to start patches -- and never did - for no reason  is ready to do it now   wt is down 2 lb  HTN in good control with 104/64 today  anxiety - doing very well with that  hardly ever needs xanax    Allergies (verified): No Known Drug Allergies  Past History:  Past Surgical History: Last updated: 04/10/2007 cataract sx  Family History: Last updated: 04/10/2007 mother breast ca sister breast ca sister lung ca  Social History: Last updated: 10/21/2008 Patient currently smokes.  no alcohol lost job 2009  Risk Factors: Smoking Status: current (04/10/2007)  Past Medical History:  HYPERTENSION, BENIGN (ICD-401.1) ADVEF, DRUG/MEDICINAL/BIOLOGICAL SUBST NOS (ICD-995.20) BREAST CANCER, FAMILY HX (ICD-V16.3) Hx of DEGENERATIVE DISC DISEASE, LUMBOSACRAL SPINE (ICD-722.52) Hx of SMOKER (ICD-305.1) HYPERTENSION (ICD-401.9) HYPERLIPIDEMIA (ICD-272.4) GERD (ICD-530.81) hyperglycemia - controlled by diet  DEPRESSION (ICD-311) ANXIETY (ICD-300.00) allergic rhinitis     Review of Systems General:   Denies chills, fatigue, fever, loss of appetite, and malaise. Eyes:  Denies blurring and eye irritation. ENT:  Complains of nasal congestion and postnasal drainage. CV:  Denies chest pain or discomfort, lightheadness, and palpitations. Resp:  Denies cough, shortness of breath, and wheezing. GI:  Denies abdominal pain, change in bowel habits, and indigestion. GU:  Denies dysuria and urinary frequency. MS:  Denies cramps and stiffness. Derm:  Denies itching, lesion(s), poor wound healing, and rash. Neuro:  Denies numbness and tingling. Psych:  Denies panic attacks, sense of great danger, and suicidal thoughts/plans. Endo:  Denies cold intolerance, excessive thirst, excessive urination, and heat intolerance. Heme:  Denies abnormal bruising and bleeding.  Physical Exam  General:  Well-developed,well-nourished,in no acute distress; alert,appropriate and cooperative throughout examination Head:  normocephalic, atraumatic, and no abnormalities observed.   Eyes:  vision grossly intact, pupils equal, pupils round, and pupils reactive to light.  no conjunctival pallor, injection or icterus  Ears:  R ear normal and L ear normal.   Nose:  no nasal discharge.  - nares are boggy  Mouth:  pharynx pink and moist.   Neck:  supple with full rom and no masses or thyromegally, no JVD or carotid bruit  Chest Wall:  No deformities, masses, or tenderness noted. Lungs:  CTA with distant bs and fair air exch no rales or wheeze Heart:  Normal rate and regular rhythm.  S1 and S2 normal without gallop, murmur, click, rub or other extra sounds. Abdomen:  Bowel sounds positive,abdomen soft and non-tender without masses, organomegaly or hernias noted. no renal bruits  Msk:  No deformity or scoliosis noted of thoracic or lumbar spine.   Pulses:  R and L carotid,radial,femoral,dorsalis pedis and posterior tibial pulses are full and equal bilaterally Extremities:  No clubbing, cyanosis, edema, or deformity noted with  normal full range of motion of all joints.   Neurologic:  sensation intact to light touch, gait normal, and DTRs symmetrical and normal.   Skin:  Intact without suspicious lesions or rashes Cervical Nodes:  No lymphadenopathy noted Psych:  normal affect, talkative and pleasant   Diabetes Management Exam:    Foot Exam (with socks and/or shoes not present):       Sensory-Pinprick/Light touch:          Left medial foot (L-4): normal          Left dorsal foot (L-5): normal          Left lateral foot (S-1): normal          Right medial foot (L-4): normal          Right dorsal foot (L-5): normal          Right lateral foot (S-1): normal       Sensory-Monofilament:          Left foot: normal          Right foot: normal       Inspection:          Left foot: normal          Right foot: normal       Nails:          Left foot: normal          Right foot: normal   Impression & Recommendations:  Problem # 1:  HYPERTENSION (ICD-401.9) Assessment Unchanged  very good control with ace  continue this and lab today Her updated medication list for this problem includes:    Zestril 10 Mg Tabs (Lisinopril) .Marland Kitchen... 1 by mouth once daily  BP today: 104/64 Prior BP: 130/62 (01/19/2010)  Labs Reviewed: K+: 4.2 (03/03/2009) Creat: : 0.9 (03/03/2009)   Chol: 128 (03/23/2010)   HDL: 42.10 (03/23/2010)   LDL: 63 (03/23/2010)   TG: 117.0 (03/23/2010)  Orders: Venipuncture (60454) TLB-Lipid Panel (80061-LIPID) TLB-Renal Function Panel (80069-RENAL) TLB-ALT (SGPT) (84460-ALT) TLB-AST (SGOT) (84450-SGOT) TLB-CBC Platelet - w/Differential (85025-CBCD) TLB-TSH (Thyroid Stimulating Hormone) (84443-TSH) TLB-A1C / Hgb A1C (Glycohemoglobin) (83036-A1C)  Problem # 2:  BREAST CANCER, FAMILY HX (ICD-V16.3) Assessment: Unchanged pt sees gyn- I asked for last mam and she does not remember where she got it  will call back with that info  Problem # 3:  Hx of SMOKER (ICD-305.1) Assessment:  Unchanged discussed in detail risks of smoking, and possible outcomes including COPD, vascular dz, cancer and also respiratory infections/sinus problems  had long discussion- unsure if pt is motivated to quit despite above enc her to get patches as planned   Problem # 4:  HYPERLIPIDEMIA (ICD-272.4) Assessment: Unchanged  this is very well controlled with higher dose zocor no side eff rev low sat fat diet - doing well with lots of veg  lab today Her updated medication list for this problem includes:    Zocor 40 Mg Tabs (Simvastatin) .Marland Kitchen... 1 by mouth once daily  Orders: Venipuncture (09811) TLB-Lipid Panel (80061-LIPID) TLB-Renal Function Panel (80069-RENAL) TLB-ALT (SGPT) (84460-ALT)  TLB-AST (SGOT) (84450-SGOT) TLB-CBC Platelet - w/Differential (85025-CBCD) TLB-TSH (Thyroid Stimulating Hormone) (84443-TSH) TLB-A1C / Hgb A1C (Glycohemoglobin) (83036-A1C)  Labs Reviewed: SGOT: 19 (03/23/2010)   SGPT: 15 (03/23/2010)   HDL:42.10 (03/23/2010), 37.70 (11/30/2009)  LDL:63 (03/23/2010), 85 (11/30/2009)  Chol:128 (03/23/2010), 141 (11/30/2009)  Trig:117.0 (03/23/2010), 94.0 (11/30/2009)  Problem # 5:  HYPERGLYCEMIA (ICD-790.29) Assessment: Unchanged  this has been in good control with last AIC 6.3 - diet and exercise lab today  qualifies for hyperglycemia instead of DM dx  disc healthy diet (low simple sugar/ choose complex carbs/ low sat fat) diet and exercise in detail  opthy up to date on ace     Labs Reviewed: Creat: 0.9 (03/03/2009)     Last Eye Exam: normal (09/24/2009) Reviewed HgBA1c results: 6.3 (03/23/2010)  6.3 (10/12/2009)  Problem # 6:  ALLERGIC RHINITIS (ICD-477.9) Assessment: Deteriorated seasonally worse- night time congestion and rhinorrhea adv to try otc antihist - see inst  nasal saline spray in eve if not imp consider nasal saline  Complete Medication List: 1)  Glucometer  .... To check glucose once daily and as needed for diabetes (250.0) 2)   Glucose Test Strips and Lancets  .... To check glucose once daily and as needed for diabetes type 2 3)  Zestril 10 Mg Tabs (Lisinopril) .Marland Kitchen.. 1 by mouth once daily 4)  Alprazolam 0.5 Mg Tabs (Alprazolam) .Marland Kitchen.. 1 by mouth once daily as needed severe anxiety 5)  Zocor 40 Mg Tabs (Simvastatin) .Marland Kitchen.. 1 by mouth once daily 6)  Aspirin Adult Low Strength 81 Mg Tbec (Aspirin) .Marland Kitchen.. 1 by mouth once daily 7)  Paxil 40 Mg Tabs (Paroxetine hcl) .Marland Kitchen.. 1 by mouth once daily  Patient Instructions: 1)  for allergies - try claritin or allegra or zyrtec over the counter 2)  also use nasal saline spray at bedtime  3)  let me know if this does not help  4)  go get patches to quit smoking - now is the time  5)  no change in medicine  6)  lab today 7)  don't forget to get your flu shot 8)  let me know when your last mammogram was when you know   Current Allergies (reviewed today): No known allergies

## 2011-01-25 ENCOUNTER — Telehealth: Payer: Self-pay | Admitting: Family Medicine

## 2011-01-26 NOTE — Progress Notes (Signed)
Summary: arms still hurting  Phone Note Call from Patient Call back at Home Phone [phone]   Caller: Patient Call For: Judith Part MD Summary of Call: Pt was seen for arm pain and was told to stop her cholesterol medicine.  She did stop the medicine but the pain is not any better.  She says she can deal with the pain- only hurts when she raises her arms or puts on a coat.  [facility] [address], if you think she should be taking an antiinflammatory. Initial call taken by: Lowella Petties CMA, AAMA,  December 13, 2010 12:58 PM  Follow-up for Phone Call        go back on the chol med since this did not help to stop it  I wonder if this could be her tendonitis coming back -- ? if cortisone shots would be an option I would like her to have appt with Dr Patsy Lager for this - please schedule if agreeable Follow-up by: Judith Part MD,  December 13, 2010 1:01 PM  Additional Follow-up for Phone Call Additional follow up Details #1::        Patient notified as instructed by telephone. Pt will start back on her cholesterol med. Pt said she will have to call back to schedule appt with Dr Patsy Lager. Pt also said she will start back on Tylenol until she can come in to see Dr Patsy Lager.Lewanda Rife LPN  December 13, 2010 4:18 PM

## 2011-01-26 NOTE — Assessment & Plan Note (Signed)
Summary: CORTISONE INJECTION IN SHOULDER   Vital Signs:  Patient profile:   70 year old female Height:      65.5 inches Weight:      143.75 pounds BMI:     23.64 Temp:     97.6 degrees F oral Pulse rate:   80 / minute Pulse rhythm:   regular BP sitting:   130 / 80  (left arm) Cuff size:   regular  Vitals Entered By: Benny Lennert CMA Duncan Dull) (January 12, 2011 10:56 AM)  History of Present Illness: Chief complaint Right injection in shoulder  70 year old here for evaluation of right shoulder pain:  for several months now, the patient has had severe pain in her shoulders, right greater than left. She is diminished range of motion bilaterally, right greater than left. She has had no incidence of any traumatic injury.  No prior traumatic fractures or operative interventions. She did have some what sounds like rotator cuff tendinitis in the left shoulder approximately 15 years ago which resulted status post subacromial injection.  She is very limited, and cannot even take care of her self, cannot wash her hair or fix her hair or is having difficulty dressing herself.  Allergies (verified): No Known Drug Allergies  Past History:  Past medical, surgical, family and social histories (including risk factors) reviewed, and no changes noted (except as noted below).  Past Medical History: Reviewed history from 08/23/2010 and no changes required.  HYPERTENSION, BENIGN (ICD-401.1) ADVEF, DRUG/MEDICINAL/BIOLOGICAL SUBST NOS (ICD-995.20) BREAST CANCER, FAMILY HX (ICD-V16.3) Hx of DEGENERATIVE DISC DISEASE, LUMBOSACRAL SPINE (ICD-722.52) Hx of SMOKER (ICD-305.1) HYPERTENSION (ICD-401.9) HYPERLIPIDEMIA (ICD-272.4) GERD (ICD-530.81) hyperglycemia - controlled by diet  DEPRESSION (ICD-311) ANXIETY (ICD-300.00) allergic rhinitis     Past Surgical History: Reviewed history from 04/10/2007 and no changes required. cataract sx  Family History: Reviewed history from 04/10/2007 and no  changes required. mother breast ca sister breast ca sister lung ca  Social History: Reviewed history from 10/21/2008 and no changes required. Patient currently smokes.  no alcohol lost job 2009  Review of Systems       REVIEW OF SYSTEMS  GEN: No systemic complaints, no fevers, chills, sweats, or other acute illnesses MSK: Detailed in the HPI GI: tolerating PO intake without difficulty Neuro: No numbness, parasthesias, or tingling associated. Otherwise the pertinent positives of the ROS are noted above.    Physical Exam  General:  GEN: Well-developed,well-nourished,in no acute distress; alert,appropriate and cooperative throughout examination HEENT: Normocephalic and atraumatic without obvious abnormalities. No apparent alopecia or balding. Ears, externally no deformities PULM: Breathing comfortably in no respiratory distress EXT: No clubbing, cyanosis, or edema PSYCH: Normally interactive. Cooperative during the interview. Pleasant. Friendly and conversant. Not anxious or depressed appearing. Normal, full affect.  Msk:  let shoulder: Nontender along clavicle and a.c. joint. Flexion and abduction both approximately 150. Lacks approximately 25 of terminal external rotation and lacks approximately 60 of internal range of motion.  Right shoulder: Nontender along clavicle. Tender at the acromioclavicular joint.  Severe restriction in all motion. Flexion and abduction to approximately 95 with significant elevation of the scapula.  Difficulty achieving 90 of abduction, but when this is achieved, there is essentially no internal or external rotation.  Crossover is positive on the right.  All other special testing is equivocal given the patient's loss of motion.  Negative drop test.  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 Emphasized that without adherence to HEP, likely will not get better.  Tylenol or NSAID of choice prn for pain relief  Patient will be sent for formal PT for aggressive frozen shoulder ROM. Will need RTC str and scapular stabilization to fix underlying mechanics. I would anticipate at least several months before the patient is having recovery of function. Full recovery of range of motion may take 6 months or greater.  intraarticular corticosteroid injections in Adhesive Capsulitis has clearly been shown to be of benefit.   Intrarticular Shoulder Injection, RIGHT Verbal consent was obtained from the patient. Risks, benefits, and alternatives were explained. Patient prepped with Betadine and Ethyl Chloride used for anesthesia. An intraarticular shoulder injection was performed using the posterior approach. The patient tolerated the procedure well and had decreased pain post injection. No complications. Injection: 9 cc of Lidocaine 1% and 1cc of Kenalog 40 mg. Needle: 22 gauge   Orders: Physical Therapy Referral (PT) Joint Aspirate / Injection, Large (20610)  Problem # 2:  ADHESIVE CAPSULITIS, LEFT (ICD-726.0) Assessment: New  Intrarticular Shoulder Injection, LEFT Verbal consent was obtained from the patient. Risks, benefits, and alternatives were explained. Patient prepped with Betadine and Ethyl Chloride used for anesthesia. An intraarticular shoulder injection was performed using the posterior approach. The patient tolerated the procedure well and had decreased pain post injection. No complications. Injection: 9 cc of Lidocaine 1% and 1cc of Kenalog 40 mg. Needle: 22 gauge   Orders: Joint Aspirate / Injection, Large (20610) Kenalog 10mg  (4units) (J3301) Kenalog 10mg  (4units) (J3301)  Complete Medication List: 1)  Glucometer  .... To check glucose once daily and as needed for  diabetes (250.0) 2)  Glucose Test Strips and Lancets  .... To check glucose once daily and as needed for diabetes type 2 3)  Zestril 10 Mg Tabs (Lisinopril) .Marland Kitchen.. 1 by mouth once daily 4)  Alprazolam 0.5 Mg Tabs (Alprazolam) .Marland Kitchen.. 1 by mouth once daily as needed severe anxiety 5)  Zocor 40 Mg Tabs (Simvastatin) .Marland Kitchen.. 1 by mouth once daily 6)  Aspirin Adult Low Strength 81 Mg Tbec (Aspirin) .Marland Kitchen.. 1 by mouth once daily 7)  Paxil 40 Mg Tabs (Paroxetine hcl) .Marland Kitchen.. 1 by mouth once daily 8)  Tylenol Extra Strength 500 Mg Tabs (Acetaminophen) .... Otc as directed.  Patient Instructions: 1)  f/u 1 month 2)  Referral Appointment Information 3)  Day/Date: 4)  Time: 5)  Place/MD: 6)  Address: 7)  Phone/Fax: 8)  Patient given appointment information. Information/Orders faxed/mailed.    Orders Added: 1)  Physical Therapy Referral [PT] 2)  Est. Patient Level IV [16109] 3)  Joint Aspirate / Injection, Large [20610] 4)  Kenalog 10mg  (4units) [J3301] 5)  Joint Aspirate / Injection, Large [20610] 6)  Kenalog 10mg  (4units) [J3301]    Current Allergies (reviewed today): No known allergies

## 2011-01-26 NOTE — Assessment & Plan Note (Signed)
Summary: PROBLEM WITH ARM/JRR   Vital Signs:  Patient profile:   70 year old female Height:      65.5 inches Weight:      142 pounds BMI:     23.35 Temp:     97.8 degrees F oral Pulse rate:   80 / minute Pulse rhythm:   regular BP sitting:   110 / 66  (left arm) Cuff size:   regular  Vitals Entered By: Lewanda Rife LPN (December 06, 2010 2:06 PM) CC: pain in shoulders and arms, especially when raises arms. Pain level when raises arm or arms is 10.   History of Present Illness: pain in shoulders and arms esp when she raises them for 1 mo  takes tylenol and it helps  R is worse than the left   no trauma  does sleep on her sides -- not now as much  now hurts arm to sleep on sides  no swelling that she can tell   this happened years ago-- tendonitis - and had to have cortisone shots  does sometimes hurt in joints and sometimes muscles     Allergies (verified): No Known Drug Allergies  Past History:  Past Medical History: Last updated: 08/23/2010  HYPERTENSION, BENIGN (ICD-401.1) ADVEF, DRUG/MEDICINAL/BIOLOGICAL SUBST NOS (ICD-995.20) BREAST CANCER, FAMILY HX (ICD-V16.3) Hx of DEGENERATIVE DISC DISEASE, LUMBOSACRAL SPINE (ICD-722.52) Hx of SMOKER (ICD-305.1) HYPERTENSION (ICD-401.9) HYPERLIPIDEMIA (ICD-272.4) GERD (ICD-530.81) hyperglycemia - controlled by diet  DEPRESSION (ICD-311) ANXIETY (ICD-300.00) allergic rhinitis     Past Surgical History: Last updated: 04/10/2007 cataract sx  Family History: Last updated: 04/10/2007 mother breast ca sister breast ca sister lung ca  Social History: Last updated: 10/21/2008 Patient currently smokes.  no alcohol lost job 2009  Risk Factors: Smoking Status: current (04/10/2007)  Review of Systems General:  Denies fatigue, loss of appetite, and malaise. Eyes:  Denies blurring and eye irritation. CV:  Denies chest pain or discomfort and palpitations. Resp:  Denies cough and shortness of breath. GI:  Denies  abdominal pain, change in bowel habits, indigestion, and nausea. GU:  Denies dysuria and urinary frequency. MS:  Complains of joint pain, muscle aches, and stiffness; denies joint redness, joint swelling, cramps, and muscle weakness. Derm:  Denies itching, lesion(s), poor wound healing, and rash. Neuro:  Denies headaches, numbness, and tingling. Heme:  Denies abnormal bruising and bleeding.  Physical Exam  General:  Well-developed,well-nourished,in no acute distress; alert,appropriate and cooperative throughout examination Head:  normocephalic, atraumatic, and no abnormalities observed.   Eyes:  vision grossly intact, pupils equal, and pupils round.   Neck:  nl rom neck without bony tenderness Chest Wall:  No deformities, masses, or tenderness noted. Lungs:  CTA with distant bs and fair air exch no rales or wheeze Heart:  Normal rate and regular rhythm. S1 and S2 normal without gallop, murmur, click, rub or other extra sounds. Msk:  no acromion tenderness or swelling of shoulders bilat deltoid tenderness pain on scratch test bilat  mildly pos hawking/ neer bilat -- not marked  nl grip  can abduct arms just over 90 deg bilat before pain starts  Extremities:  No clubbing, cyanosis, edema, or deformity noted with normal full range of motion of all joints.   Neurologic:  strength normal in all extremities, sensation intact to light touch, gait normal, and DTRs symmetrical and normal.   Skin:  Intact without suspicious lesions or rashes Cervical Nodes:  No lymphadenopathy noted Psych:  normal affect, talkative and pleasant    Impression &  Recommendations:  Problem # 1:  SHOULDER PAIN, BILATERAL (ICD-719.41) Assessment New shoulder girdle pain that is somewhat positional  tender in deltoid area and some c/o overall full body pain when off tylenol need to consider zocor rxn- will hold that and update in a week if no imp - will tx as tendonitis wth nsaid and consider inj Her updated  medication list for this problem includes:    Aspirin Adult Low Strength 81 Mg Tbec (Aspirin) .Marland Kitchen... 1 by mouth once daily    Tylenol Extra Strength 500 Mg Tabs (Acetaminophen) ..... Otc as directed.  Complete Medication List: 1)  Glucometer  .... To check glucose once daily and as needed for diabetes (250.0) 2)  Glucose Test Strips and Lancets  .... To check glucose once daily and as needed for diabetes type 2 3)  Zestril 10 Mg Tabs (Lisinopril) .Marland Kitchen.. 1 by mouth once daily 4)  Alprazolam 0.5 Mg Tabs (Alprazolam) .Marland Kitchen.. 1 by mouth once daily as needed severe anxiety 5)  Zocor 40 Mg Tabs (Simvastatin) .Marland Kitchen.. 1 by mouth once daily 6)  Aspirin Adult Low Strength 81 Mg Tbec (Aspirin) .Marland Kitchen.. 1 by mouth once daily 7)  Paxil 40 Mg Tabs (Paroxetine hcl) .Marland Kitchen.. 1 by mouth once daily 8)  Promethazine Hcl 25 Mg Tabs (Promethazine hcl) .Marland Kitchen.. 1 by mouth up to three times a day as needed nausea caution may sedate 9)  Tylenol Extra Strength 500 Mg Tabs (Acetaminophen) .... Otc as directed.  Patient Instructions: 1)  I don't know if your pain is from your cholesterol medicine (zocor/ simvastatin) or from tendonitis 2)  hold your cholesterol medicine for 1 week 3)  then call and update me  4)  if not improved we will try an anti inflammatory / consider injection  5)  try to avoid tylenol for a week just so it does not mask your symptoms   Orders Added: 1)  Est. Patient Level III [04540]    Current Allergies (reviewed today): No known allergies

## 2011-02-01 NOTE — Progress Notes (Signed)
Summary: Paxil 40mg   Phone Note Refill Request Call back at [phone] Message from:  [facility] [address] on January 25, 2011 2:52 PM  Refills Requested: Medication #1:  PAXIL 40 MG TABS 1 by mouth once daily   Last Refilled: 12/13/2010 [facility] [address] [facility] request refill for Paxil 40mg . Last refill date 12/13/10.Please advise.    Method Requested: Telephone to Pharmacy Initial call taken by: Lewanda Rife LPN,  January 25, 2011 2:53 PM  Follow-up for Phone Call        px written on EMR for call in  Follow-up by: Judith Part MD,  January 25, 2011 4:46 PM  Additional Follow-up for Phone Call Additional follow up Details #1::        [facility] [address] [facility] as instructed. Lewanda Rife LPN  January 25, 2011 4:55 PM     New/Updated Medications: PAXIL 40 MG TABS (PAROXETINE HCL) 1 by mouth once daily Prescriptions: PAXIL 40 MG TABS (PAROXETINE HCL) 1 by mouth once daily  #90 x 3   Entered and Authorized by:   Judith Part MD   Signed by:   Lewanda Rife LPN on 45/40/9811   Method used:   Telephoned to ...       [facility]       [address]       [place], [place]  [address]       Ph: [phone]       Fax: [phone]   RxID:   [id number]

## 2011-02-07 ENCOUNTER — Telehealth: Payer: Self-pay | Admitting: Family Medicine

## 2011-02-15 ENCOUNTER — Ambulatory Visit (INDEPENDENT_AMBULATORY_CARE_PROVIDER_SITE_OTHER): Payer: Medicare Other | Admitting: Family Medicine

## 2011-02-15 ENCOUNTER — Encounter: Payer: Self-pay | Admitting: Family Medicine

## 2011-02-15 DIAGNOSIS — M7500 Adhesive capsulitis of unspecified shoulder: Secondary | ICD-10-CM

## 2011-02-15 NOTE — Progress Notes (Signed)
Summary: refill request for alprazolam  Phone Note Refill Request Message from:  Fax from Pharmacy  Refills Requested: Medication #1:  ALPRAZOLAM 0.5 MG TABS 1 by mouth once daily as needed severe anxiety   Last Refilled: 12/31/2010 Faxed request from [facility] [address], [phone].  Initial call taken by: Lowella Petties CMA, AAMA,  February 07, 2011 10:20 AM  Follow-up for Phone Call        px written on EMR for call in  Follow-up by: Judith Part MD,  February 07, 2011 1:50 PM  Additional Follow-up for Phone Call Additional follow up Details #1::        Medication phoned [facility] [address] [facility] as instructed. Lewanda Rife LPN  February 07, 2011 2:55 PM     New/Updated Medications: ALPRAZOLAM 0.5 MG TABS (ALPRAZOLAM) 1 by mouth once daily as needed severe anxiety Prescriptions: ALPRAZOLAM 0.5 MG TABS (ALPRAZOLAM) 1 by mouth once daily as needed severe anxiety  #30 x 3   Entered and Authorized by:   Judith Part MD   Signed by:   Judith Part MD on 02/07/2011   Method used:   Telephoned to ...       [facility]       [address]       [place], [place]  [address]       Ph: [phone]       Fax: [phone]   RxID:   [id number]

## 2011-02-17 ENCOUNTER — Other Ambulatory Visit: Payer: Self-pay

## 2011-02-21 ENCOUNTER — Ambulatory Visit: Payer: Self-pay | Admitting: Family Medicine

## 2011-02-21 NOTE — Assessment & Plan Note (Signed)
Summary: roa 1 mths cyd   Vital Signs:  Patient profile:   70 year old female Height:      65.5 inches Weight:      143.25 pounds BMI:     23.56 Temp:     98.5 degrees F oral Pulse rate:   72 / minute Pulse rhythm:   regular BP sitting:   110 / 64  (right arm) Cuff size:   regular  Vitals Entered By: Linde Gillis CMA Duncan Dull) (February 15, 2011 12:04 PM) CC: one month follow up   History of Present Illness:  f/u B frozen shoulder, dramatically improved. s/p B intraarticular injections and has been diligent with HEP for frozen shoulder and RTC and scapular stabilization. Doing formal PT at [facility].   01/12/2011 OV 70 year old here for evaluation of right shoulder pain:  for several months now, the patient has had severe pain in her shoulders, right greater than left. She is diminished range of motion bilaterally, right greater than left. She has had no incidence of any traumatic injury.  No prior traumatic fractures or operative interventions. She did have some what sounds like rotator cuff tendinitis in the left shoulder approximately 15 years ago which resulted status post subacromial injection.  She is very limited, and cannot even take care of her self, cannot wash her hair or fix her hair or is having difficulty dressing herself.  Allergies (verified): No Known Drug Allergies  Past History:  Past medical, surgical, family and social histories (including risk factors) reviewed, and no changes noted (except as noted below).  Past Medical History: Reviewed history from 08/23/2010 and no changes required.  HYPERTENSION, BENIGN (ICD-401.1) ADVEF, DRUG/MEDICINAL/BIOLOGICAL SUBST NOS (ICD-995.20) BREAST CANCER, FAMILY HX (ICD-V16.3) Hx of DEGENERATIVE DISC DISEASE, LUMBOSACRAL SPINE (ICD-722.52) Hx of SMOKER (ICD-305.1) HYPERTENSION (ICD-401.9) HYPERLIPIDEMIA (ICD-272.4) GERD (ICD-530.81) hyperglycemia - controlled by diet  DEPRESSION (ICD-311) ANXIETY  (ICD-300.00) allergic rhinitis     Past Surgical History: Reviewed history from 04/10/2007 and no changes required. cataract sx  Family History: Reviewed history from 04/10/2007 and no changes required. mother breast ca sister breast ca sister lung ca  Social History: Reviewed history from 10/21/2008 and no changes required. Patient currently smokes.  no alcohol lost job 2009  Review of Systems       REVIEW OF SYSTEMS  GEN: No systemic complaints, no fevers, chills, sweats, or other acute illnesses MSK: Detailed in the HPI - some lateral L shoulder pain GI: tolerating PO intake without difficulty Neuro: No numbness, parasthesias, or tingling associated. Otherwise the pertinent positives of the ROS are noted above.    Physical Exam  General:  GEN: Well-developed,well-nourished,in no acute distress; alert,appropriate and cooperative throughout examination HEENT: Normocephalic and atraumatic without obvious abnormalities. No apparent alopecia or balding. Ears, externally no deformities PULM: Breathing comfortably in no respiratory distress EXT: No clubbing, cyanosis, or edema PSYCH: Normally interactive. Cooperative during the interview. Pleasant. Friendly and conversant. Not anxious or depressed appearing. Normal, full affect.  Msk:  let shoulder: Nontender along clavicle and a.c. joint. Flexion and abduction both approximately 180. Lacks approximately 10 of terminal external rotation and lacks approximately 40 of internal range of motion.  Right shoulder: Nontender along clavicle. Tender at the acromioclavicular joint.  Severe restriction in all motion. Flexion and abduction to approximately 175  EROM loss of 50-60 deg, IROM loss of 75 deg  neer and hawkins pos on R > L, but relatively equivocal given loss of IROM  Negative drop test.  Strength  is intact and 4+5 in all planes   Impression & Recommendations:  Problem # 1:  ADHESIVE CAPSULITIS, RIGHT  (ICD-726.0) Assessment Improved Impressively improved in 1 month. c/w HEP, PT. I expect she will continue to improve over the next month or so.  f/u with an as needed basis or if progression delays  Problem # 2:  ADHESIVE CAPSULITIS, LEFT (ICD-726.0) Assessment: Improved  Complete Medication List: 1)  Glucometer  .... To check glucose once daily and as needed for diabetes (250.0) 2)  Glucose Test Strips and Lancets  .... To check glucose once daily and as needed for diabetes type 2 3)  Zestril 10 Mg Tabs (Lisinopril) .Marland Kitchen.. 1 by mouth once daily 4)  Alprazolam 0.5 Mg Tabs (Alprazolam) .Marland Kitchen.. 1 by mouth once daily as needed severe anxiety 5)  Zocor 40 Mg Tabs (Simvastatin) .Marland Kitchen.. 1 by mouth once daily 6)  Aspirin Adult Low Strength 81 Mg Tbec (Aspirin) .Marland Kitchen.. 1 by mouth once daily 7)  Paxil 40 Mg Tabs (Paroxetine hcl) .Marland Kitchen.. 1 by mouth once daily 8)  Tylenol Extra Strength 500 Mg Tabs (Acetaminophen) .... Otc as directed. 9)  Diclofenac Sodium 75 Mg Tbec (Diclofenac sodium) .Marland Kitchen.. 1 by mouth two times a day  Patient Instructions: 1)  f/u as needed Prescriptions: DICLOFENAC SODIUM 75 MG TBEC (DICLOFENAC SODIUM) 1 by mouth two times a day  #60 x 1   Entered and Authorized by:   Hannah Beat MD   Signed by:   Hannah Beat MD on 02/15/2011   Method used:   [facility]        [id number]       [address]       [place], [place]  [id number]       Ph: [phone] or [phone]       Fax: [phone]   RxID:   [id number]    Orders Added: 1)  Est. Patient Level III [64332]    Current Allergies (reviewed today): No known allergies

## 2011-03-02 ENCOUNTER — Encounter: Payer: Self-pay | Admitting: Family Medicine

## 2011-03-13 ENCOUNTER — Other Ambulatory Visit (INDEPENDENT_AMBULATORY_CARE_PROVIDER_SITE_OTHER): Payer: Medicare Other

## 2011-03-13 ENCOUNTER — Other Ambulatory Visit: Payer: Self-pay | Admitting: Family Medicine

## 2011-03-13 ENCOUNTER — Encounter: Payer: Self-pay | Admitting: *Deleted

## 2011-03-13 ENCOUNTER — Other Ambulatory Visit: Payer: Self-pay

## 2011-03-13 DIAGNOSIS — R7309 Other abnormal glucose: Secondary | ICD-10-CM

## 2011-03-13 DIAGNOSIS — E785 Hyperlipidemia, unspecified: Secondary | ICD-10-CM

## 2011-03-13 LAB — LIPID PANEL
Cholesterol: 140 mg/dL (ref 0–200)
LDL Cholesterol: 84 mg/dL (ref 0–99)
VLDL: 14.6 mg/dL (ref 0.0–40.0)

## 2011-03-13 LAB — AST: AST: 19 U/L (ref 0–37)

## 2011-03-13 LAB — HEMOGLOBIN A1C: Hgb A1c MFr Bld: 6.6 % — ABNORMAL HIGH (ref 4.6–6.5)

## 2011-03-14 NOTE — Miscellaneous (Addendum)
Summary: [facility]  [facility]   Imported By: Kassie Mends 03/07/2011 10:08:22  _____________________________________________________________________  External Attachment:    Type:   Image     Comment:   External Document

## 2011-03-15 ENCOUNTER — Other Ambulatory Visit: Payer: Self-pay

## 2011-03-17 ENCOUNTER — Telehealth: Payer: Self-pay

## 2011-03-17 NOTE — Telephone Encounter (Signed)
Patient notified as instructed by telephone. 

## 2011-03-17 NOTE — Telephone Encounter (Signed)
Pt's appt 03/20/11 was changed by our office until 04/12/11. Pt wants to get lab results. Pt understands it my be next week when gets results.Please advise.

## 2011-03-17 NOTE — Telephone Encounter (Signed)
Looks like cholesterol is down just a bit and AIC (for sugar) is stable Overall not a lot of change  Will disc further at f/u -- thanks for the patience

## 2011-03-20 ENCOUNTER — Ambulatory Visit: Payer: Self-pay | Admitting: Family Medicine

## 2011-04-07 ENCOUNTER — Encounter: Payer: Self-pay | Admitting: Family Medicine

## 2011-04-12 ENCOUNTER — Ambulatory Visit: Payer: Self-pay | Admitting: Family Medicine

## 2011-05-15 ENCOUNTER — Encounter: Payer: Self-pay | Admitting: Family Medicine

## 2011-05-15 ENCOUNTER — Ambulatory Visit (INDEPENDENT_AMBULATORY_CARE_PROVIDER_SITE_OTHER): Payer: Medicare Other | Admitting: Family Medicine

## 2011-05-15 DIAGNOSIS — I10 Essential (primary) hypertension: Secondary | ICD-10-CM

## 2011-05-15 DIAGNOSIS — F17200 Nicotine dependence, unspecified, uncomplicated: Secondary | ICD-10-CM

## 2011-05-15 DIAGNOSIS — R7309 Other abnormal glucose: Secondary | ICD-10-CM

## 2011-05-15 DIAGNOSIS — F411 Generalized anxiety disorder: Secondary | ICD-10-CM

## 2011-05-15 DIAGNOSIS — E785 Hyperlipidemia, unspecified: Secondary | ICD-10-CM

## 2011-05-15 MED ORDER — PROMETHAZINE HCL 25 MG PO TABS: 25.0000 mg | ORAL_TABLET | Freq: Three times a day (TID) | ORAL | Status: DC | PRN

## 2011-05-15 NOTE — Assessment & Plan Note (Signed)
This is fairly stable Pt notes that when anxious she gets nauseated - and may want to try phenergan instead of xanax in those instances Px written- understands not to mix the 2 Continue paxil F/u 6 mo

## 2011-05-15 NOTE — Progress Notes (Signed)
Subjective:    Patient ID: Nancy Herrera, female    DOB: 1941/05/28, 70 y.o.   MRN: [id number]  HPI Here for f/u of lipids/ smoking/ anxiety, HTN and hyperglycemia  Is feeling good - overall  No new medical issues   Smoking status- a little bit more - up to 10 per day  Is going to get the patch -- husband is really supportive  May get it today Now is really motivated to stop   HTN is well controlled with ace 100/64 No cp or edema or headaches   Hyperglycemia  aic is 6.6 down from 6.7 This is diet controlled-- she really watches sugars and starches closely Rides her bicycle regularly  Walks a lot and shops too  On ace for renal prot Eye exam -- last Dr Hazle Quant over a year --has appt June the 1st  Vision has been fine   anx- on paxil and xanax (prn) paxil 40 mg  Xanax one per day  Gets nausea when anx - phenergan helps this  Best friend just passed away   Lipids were imp in march with LDL 84 down from 100 Lab Results  Component Value Date   CHOL 140 03/13/2011   CHOL 150 08/23/2010   CHOL 128 03/23/2010   Lab Results  Component Value Date   HDL 41.10 03/13/2011   HDL 91.47* 08/23/2010   HDL 42.10 03/23/2010   Lab Results  Component Value Date   LDLCALC 84 03/13/2011   LDLCALC 100* 08/23/2010   LDLCALC 63 03/23/2010   Lab Results  Component Value Date   TRIG 73.0 03/13/2011   TRIG 80.0 08/23/2010   TRIG 117.0 03/23/2010   Lab Results  Component Value Date   CHOLHDL 3 03/13/2011   CHOLHDL 4 08/23/2010   CHOLHDL 3 03/23/2010   Lab Results  Component Value Date   LDLDIRECT 152.8 10/12/2009   LDLDIRECT 164.0 03/03/2009   LDLDIRECT 142.7 04/23/2007   overall improved  On zocor Diet --good low sat fat   Past Medical History  Diagnosis Date  . Hypertension   . Cancer   . Degenerative disc disease, lumbar   . Hyperlipidemia   . GERD (gastroesophageal reflux disease)   . Hyperglycemia     controlled by diet  . Anxiety   . Depression   . Allergy     allergic rhinitis     History   Social History  . Marital Status: Married    Spouse Name: N/A    Number of Children: N/A  . Years of Education: N/A   Occupational History  . Not on file.   Social History Main Topics  . Smoking status: Current Everyday Smoker  . Smokeless tobacco: Not on file  . Alcohol Use: No  . Drug Use:   . Sexually Active:    Other Topics Concern  . Not on file   Social History Narrative  . No narrative on file    Family History  Problem Relation Age of Onset  . Cancer Mother     breast cancer  . Cancer Sister     breast cancer & lung cancer      Review of Systems Review of Systems  Constitutional: Negative for fever, appetite change, fatigue and unexpected weight change.  Eyes: Negative for pain and visual disturbance.  Respiratory: Negative for cough and shortness of breath.   Cardiovascular: Negative.   Gastrointestinal: Negative for , diarrhea and constipation. pos for nausea when she gets very nervous  Genitourinary: Negative for urgency and frequency.  Skin: Negative for pallor.  Neurological: Negative for weakness, light-headedness, numbness and headaches.  Hematological: Negative for adenopathy. Does not bruise/bleed easily.  Psychiatric/Behavioral: Negative for dysphoric mood. Pos for intermittent anxiety          Objective:   Physical Exam        Assessment & Plan:

## 2011-05-15 NOTE — Assessment & Plan Note (Signed)
Improved further with zocor and diet  Will continue to monitor Lab and f/u 6 mo  Rev lab with pt Rev low sat fat diet

## 2011-05-15 NOTE — Patient Instructions (Signed)
No change in medicines  Use phenergan for nausea as needed - do not mix with xanax  Continue good diet and exercise  Work on quitting smoking with patch -- and aim for July 4th for quit date  Schedule 6 months  30 minute check up with labs prior

## 2011-05-15 NOTE — Assessment & Plan Note (Signed)
Improved with better diet and exercise opthy upcoming Will keep working on habits Is diet controlled Lab and f/u 6 mo

## 2011-05-15 NOTE — Assessment & Plan Note (Signed)
10 cig per day Disc what to expect with quitting Trying patch  Quit date July 4th

## 2011-06-02 ENCOUNTER — Other Ambulatory Visit: Payer: Self-pay | Admitting: *Deleted

## 2011-06-02 MED ORDER — ALPRAZOLAM 0.5 MG PO TABS: ORAL_TABLET | ORAL | Status: DC

## 2011-06-02 NOTE — Telephone Encounter (Signed)
Px written for call in   

## 2011-06-02 NOTE — Telephone Encounter (Signed)
Medication ph[facility] to [facility] [address] [facility] as instructed.

## 2011-07-11 ENCOUNTER — Other Ambulatory Visit: Payer: Self-pay | Admitting: *Deleted

## 2011-07-11 MED ORDER — ALPRAZOLAM 0.5 MG PO TABS: ORAL_TABLET | ORAL | Status: DC

## 2011-07-11 NOTE — Telephone Encounter (Signed)
Medication ph[facility] to [facility] [address] [facility] as instructed.

## 2011-07-11 NOTE — Telephone Encounter (Signed)
Px written for call in   

## 2011-07-11 NOTE — Telephone Encounter (Signed)
Last filled 06/02/11.

## 2011-11-07 ENCOUNTER — Telehealth: Payer: Self-pay | Admitting: Family Medicine

## 2011-11-07 DIAGNOSIS — R7309 Other abnormal glucose: Secondary | ICD-10-CM

## 2011-11-07 DIAGNOSIS — I10 Essential (primary) hypertension: Secondary | ICD-10-CM

## 2011-11-07 DIAGNOSIS — K219 Gastro-esophageal reflux disease without esophagitis: Secondary | ICD-10-CM

## 2011-11-07 DIAGNOSIS — E785 Hyperlipidemia, unspecified: Secondary | ICD-10-CM

## 2011-11-07 NOTE — Telephone Encounter (Signed)
Message copied by Judy Pimple on Tue Nov 07, 2011  8:43 PM ------      Message from: Baldomero Lamy      Created: Thu Nov 02, 2011  8:09 AM      Regarding: Cpx labs Wed 11/14       Please order  future cpx labs for pt's upcomming lab appt.      Thanks      Rodney Booze

## 2011-11-08 ENCOUNTER — Other Ambulatory Visit: Payer: Medicare Other

## 2011-11-15 ENCOUNTER — Ambulatory Visit: Payer: Medicare Other | Admitting: Family Medicine

## 2011-11-30 ENCOUNTER — Other Ambulatory Visit: Payer: Self-pay

## 2011-11-30 MED ORDER — ALPRAZOLAM 0.5 MG PO TABS: ORAL_TABLET | ORAL | Status: DC

## 2011-11-30 NOTE — Telephone Encounter (Signed)
[facility] [address] request refill Alprazolam 0.5 mg last filled 10/04/11 and pt last seen 05/15/11.Please advise.

## 2011-11-30 NOTE — Telephone Encounter (Signed)
Px written for call in   

## 2011-11-30 NOTE — Telephone Encounter (Signed)
Rx called in as directed.   

## 2011-12-28 ENCOUNTER — Other Ambulatory Visit (INDEPENDENT_AMBULATORY_CARE_PROVIDER_SITE_OTHER): Payer: Medicare Other

## 2011-12-28 DIAGNOSIS — K219 Gastro-esophageal reflux disease without esophagitis: Secondary | ICD-10-CM

## 2011-12-28 DIAGNOSIS — R7309 Other abnormal glucose: Secondary | ICD-10-CM

## 2011-12-28 DIAGNOSIS — E785 Hyperlipidemia, unspecified: Secondary | ICD-10-CM

## 2011-12-28 DIAGNOSIS — I10 Essential (primary) hypertension: Secondary | ICD-10-CM

## 2011-12-28 LAB — COMPREHENSIVE METABOLIC PANEL
AST: 18 U/L (ref 0–37)
BUN: 11 mg/dL (ref 6–23)
Calcium: 9.1 mg/dL (ref 8.4–10.5)
Chloride: 99 mEq/L (ref 96–112)
Creatinine, Ser: 0.9 mg/dL (ref 0.4–1.2)
GFR: 69.91 mL/min (ref >60.00)
Total Bilirubin: 0.4 mg/dL (ref 0.3–1.2)

## 2011-12-28 LAB — CBC WITH DIFFERENTIAL/PLATELET
Basophils Absolute: 0.1 10*3/uL (ref 0.0–0.1)
Basophils Relative: 0.6 % (ref 0.0–3.0)
Eosinophils Absolute: 0.2 10*3/uL (ref 0.0–0.7)
HCT: 43.7 % (ref 36.0–46.0)
Hemoglobin: 14 g/dL (ref 12.0–15.0)
Lymphocytes Relative: 28 % (ref 12.0–46.0)
Lymphs Abs: 2.9 10*3/uL (ref 0.7–4.0)
MCHC: 32 g/dL (ref 30.0–36.0)
MCV: 81.1 fl (ref 78.0–100.0)
Monocytes Absolute: 0.7 10*3/uL (ref 0.1–1.0)
Neutro Abs: 6.4 10*3/uL (ref 1.4–7.7)
RBC: 5.39 Mil/uL — ABNORMAL HIGH (ref 3.87–5.11)
RDW: 18.3 % — ABNORMAL HIGH (ref 11.5–14.6)

## 2011-12-28 LAB — LIPID PANEL
Cholesterol: 144 mg/dL (ref 0–200)
HDL: 32.6 mg/dL — ABNORMAL LOW (ref >39.00)
LDL Cholesterol: 88 mg/dL (ref 0–99)
Triglycerides: 116 mg/dL (ref 0.0–149.0)

## 2012-01-03 ENCOUNTER — Ambulatory Visit (INDEPENDENT_AMBULATORY_CARE_PROVIDER_SITE_OTHER): Payer: Medicare Other | Admitting: Family Medicine

## 2012-01-03 ENCOUNTER — Encounter: Payer: Self-pay | Admitting: Family Medicine

## 2012-01-03 VITALS — BP 100/64 | HR 80 | Temp 98.2°F | Ht 65.5 in | Wt 147.5 lb

## 2012-01-03 DIAGNOSIS — R309 Painful micturition, unspecified: Secondary | ICD-10-CM

## 2012-01-03 DIAGNOSIS — I10 Essential (primary) hypertension: Secondary | ICD-10-CM

## 2012-01-03 DIAGNOSIS — R300 Dysuria: Secondary | ICD-10-CM

## 2012-01-03 DIAGNOSIS — E785 Hyperlipidemia, unspecified: Secondary | ICD-10-CM

## 2012-01-03 DIAGNOSIS — N390 Urinary tract infection, site not specified: Secondary | ICD-10-CM | POA: Insufficient documentation

## 2012-01-03 DIAGNOSIS — F17200 Nicotine dependence, unspecified, uncomplicated: Secondary | ICD-10-CM

## 2012-01-03 DIAGNOSIS — R7309 Other abnormal glucose: Secondary | ICD-10-CM

## 2012-01-03 LAB — POCT URINALYSIS DIPSTICK
Ketones, UA: NEGATIVE
Spec Grav, UA: 1.01
pH, UA: 6.5

## 2012-01-03 MED ORDER — CIPROFLOXACIN HCL 250 MG PO TABS: 250.0000 mg | ORAL_TABLET | Freq: Two times a day (BID) | ORAL | Status: AC

## 2012-01-03 NOTE — Assessment & Plan Note (Signed)
a1c is stable/ imp at 6.5 Diet controlled only  Rev low glycemic diet- could do a bit better Recommend 5 d on exercise bike per week  F/u 6 mo for annual exam

## 2012-01-03 NOTE — Progress Notes (Signed)
Subjective:    Patient ID: Nancy Herrera, female    DOB: [birth year], 71 y.o.   MRN: [id number]  HPI Here for f/u and then uti  ua is pos today  Started 2-3 days ago -- hurts to urinate / dysuria / no bladder pain , some frequency and urgency  No fever or back pain   Lab Results  Component Value Date   CHOL 144 12/28/2011   CHOL 140 03/13/2011   CHOL 150 08/23/2010   Lab Results  Component Value Date   HDL 32.60* 12/28/2011   HDL 14.78 03/13/2011   HDL 29.56* 08/23/2010   Lab Results  Component Value Date   LDLCALC 88 12/28/2011   LDLCALC 84 03/13/2011   LDLCALC 100* 08/23/2010   Lab Results  Component Value Date   TRIG 116.0 12/28/2011   TRIG 73.0 03/13/2011   TRIG 80.0 08/23/2010   Lab Results  Component Value Date   CHOLHDL 4 12/28/2011   CHOLHDL 3 03/13/2011   CHOLHDL 4 08/23/2010   Lab Results  Component Value Date   LDLDIRECT 152.8 10/12/2009   LDLDIRECT 164.0 03/03/2009   LDLDIRECT 142.7 04/23/2007   down a bit from last time  No trouble with her zocor  Trying the best she can for low sat fat diet   bp is 100/64      Today No cp or palpitations or headaches or edema  No side effects to medicines   On ace  DM Lab Results  Component Value Date   HGBA1C 6.5 12/28/2011     This is down from 6.6- happy with that  Staying away from sugar and starch (esp sweets )  Is utd eye exam -- will need to go soon with Dr Hazle Quant   Was doing really good with smoking- quit for a while-- used elect cig - made her cough  Then started back  Wants to try one with water vapor  Will try a patch  Anxiety / worried about husband -- going for surgery -- and this caused her to start back   Patient Active Problem List  Diagnoses  . HYPERLIPIDEMIA  . ANXIETY  . SMOKER  . DEPRESSION  . HYPERTENSION  . ALLERGIC RHINITIS  . GERD  . DEGENERATIVE DISC DISEASE, LUMBOSACRAL SPINE  . HYPERGLYCEMIA  . SHOULDER PAIN, BILATERAL  . Adhesive capsulitis of shoulder  . UTI (lower urinary tract  infection)   Past Medical History  Diagnosis Date  . Hypertension   . Cancer   . Degenerative disc disease, lumbar   . Hyperlipidemia   . GERD (gastroesophageal reflux disease)   . Hyperglycemia     controlled by diet  . Anxiety   . Depression   . Allergy     allergic rhinitis   Past Surgical History  Procedure Date  . Cataract extraction    History  Substance Use Topics  . Smoking status: Current Everyday Smoker  . Smokeless tobacco: Not on file  . Alcohol Use: No   Family History  Problem Relation Age of Onset  . Cancer Mother     breast cancer  . Cancer Sister     breast cancer & lung cancer   No Known Allergies Current Outpatient Prescriptions on File Prior to Visit  Medication Sig Dispense Refill  . acetaminophen (TYLENOL) 500 MG tablet OTC as directed       . ALPRAZolam (XANAX) 0.5 MG tablet 1 tablet by mouth once daily as needed severe anxiety.  30 tablet  2  . aspirin 81 MG tablet Take 81 mg by mouth daily.        Marland Kitchen glucose blood test strip To check glucose once daily and as needed for diabetes type 2       . glucose monitoring kit (FREESTYLE) monitoring kit To check glucose once daily and as needed for diabetes (250.0)       . Lancets MISC To check glucose once daily and as needed for diabetes type 2       . lisinopril (PRINIVIL,ZESTRIL) 10 MG tablet Take 10 mg by mouth daily.        Marland Kitchen PARoxetine (PAXIL) 40 MG tablet Take 40 mg by mouth daily.        . simvastatin (ZOCOR) 40 MG tablet Take 40 mg by mouth daily.        . diclofenac (VOLTAREN) 75 MG EC tablet Take 75 mg by mouth 2 (two) times daily.           Review of Systems Review of Systems  Constitutional: Negative for fever, appetite change, fatigue and unexpected weight change.  Eyes: Negative for pain and visual disturbance.  Respiratory: Negative for cough and shortness of breath.   Cardiovascular: Negative for cp or palpitations    Gastrointestinal: Negative for nausea, diarrhea and constipation.   Genitourinary: pos for urgency and frequency. neg for hematuria or flank pain  Skin: Negative for pallor or rash   Neurological: Negative for weakness, light-headedness, numbness and headaches.  Hematological: Negative for adenopathy. Does not bruise/bleed easily.  Psychiatric/Behavioral: Negative for dysphoric mood. The patient is not nervous/anxious.          Objective:   Physical Exam  Constitutional: She appears well-developed and well-nourished. No distress.  HENT:  Head: Normocephalic and atraumatic.  Mouth/Throat: Oropharynx is clear and moist.  Eyes: Conjunctivae and EOM are normal. Pupils are equal, round, and reactive to light. No scleral icterus.  Neck: Normal range of motion. Neck supple. No JVD present. Carotid bruit is not present. No thyromegaly present.  Cardiovascular: Normal rate, regular rhythm, normal heart sounds and intact distal pulses.  Exam reveals no gallop.   Pulmonary/Chest: Effort normal and breath sounds normal. No respiratory distress. She has no wheezes.  Abdominal: Soft. Bowel sounds are normal. She exhibits no distension, no abdominal bruit and no mass. There is no tenderness.       No suprapubic tenderness    Musculoskeletal: Normal range of motion. She exhibits no edema and no tenderness.  Lymphadenopathy:    She has no cervical adenopathy.  Neurological: She is alert. She has normal reflexes. No cranial nerve deficit. She exhibits normal muscle tone. Coordination normal.  Skin: Skin is warm and dry. No rash noted. No erythema. No pallor.  Psychiatric: She has a normal mood and affect.       A bit anxious baseline - talks about stress at home  Good eye contact          Assessment & Plan:

## 2012-01-03 NOTE — Patient Instructions (Signed)
Drink lots of water  If urine symptoms do not improve - update me  Take your cipro as directed  Follow up in 6 months with labs prior for annual exam  Really work on the smoking

## 2012-01-03 NOTE — Assessment & Plan Note (Signed)
Disc goals for lipids and reasons to control them Rev labs with pt Rev low sat fat diet in detail  Will continue zocor LDL calc is at goal Re check 6 mo

## 2012-01-03 NOTE — Assessment & Plan Note (Signed)
Disc in detail risks of smoking and possible outcomes including copd, vascular/ heart disease, cancer , respiratory and sinus infections  Pt voices understanding  She quit briefly with non nicotene elect cig Will try it again  Will also try low dose patch and update Is comitted to quit

## 2012-01-03 NOTE — Assessment & Plan Note (Signed)
bp in fair control at this time  No changes needed  Disc lifstyle change with low sodium diet and exercise   

## 2012-01-03 NOTE — Assessment & Plan Note (Signed)
Mild/ uncomplicated tx with 5 d of cipro Disc fluids  cx urine and update

## 2012-01-04 LAB — POCT UA - MICROSCOPIC ONLY

## 2012-03-04 ENCOUNTER — Other Ambulatory Visit: Payer: Self-pay | Admitting: Family Medicine

## 2012-03-04 MED ORDER — SIMVASTATIN 40 MG PO TABS: 40.0000 mg | ORAL_TABLET | Freq: Every day | ORAL | Status: DC

## 2012-03-04 NOTE — Telephone Encounter (Signed)
Received faxed refill request from pharmacy. Refill sent to pharmacy electronically. 

## 2012-04-10 NOTE — Telephone Encounter (Signed)
Pharmacy is asking to change remaining refills to a 90 day supply, advised ok for both meds, # 90 each with 0 refills called to [facility] [address].

## 2012-05-22 ENCOUNTER — Other Ambulatory Visit: Payer: Self-pay | Admitting: *Deleted

## 2012-05-22 MED ORDER — ALPRAZOLAM 0.5 MG PO TABS: ORAL_TABLET | ORAL | Status: DC

## 2012-05-22 NOTE — Telephone Encounter (Signed)
[facility]

## 2012-05-22 NOTE — Telephone Encounter (Signed)
Px written for call in   

## 2012-06-22 ENCOUNTER — Telehealth: Payer: Self-pay | Admitting: Family Medicine

## 2012-06-22 DIAGNOSIS — M25519 Pain in unspecified shoulder: Secondary | ICD-10-CM

## 2012-06-22 DIAGNOSIS — E785 Hyperlipidemia, unspecified: Secondary | ICD-10-CM

## 2012-06-22 DIAGNOSIS — R7309 Other abnormal glucose: Secondary | ICD-10-CM

## 2012-06-22 DIAGNOSIS — I10 Essential (primary) hypertension: Secondary | ICD-10-CM

## 2012-06-22 DIAGNOSIS — K219 Gastro-esophageal reflux disease without esophagitis: Secondary | ICD-10-CM

## 2012-06-22 NOTE — Telephone Encounter (Signed)
Message copied by Judy Pimple on Sat Jun 22, 2012  2:13 PM ------      Message from: Baldomero Lamy      Created: Fri Jun 21, 2012 10:36 AM      Regarding: Cpx labs Tues 7/2       Please order  future cpx labs for pt's upcomming lab appt.      Thanks      Rodney Booze

## 2012-06-25 ENCOUNTER — Other Ambulatory Visit: Payer: Medicare Other

## 2012-07-02 ENCOUNTER — Encounter: Payer: Medicare Other | Admitting: Family Medicine

## 2012-07-11 ENCOUNTER — Other Ambulatory Visit: Payer: Self-pay | Admitting: Family Medicine

## 2012-07-11 NOTE — Telephone Encounter (Signed)
Will refill electronically  

## 2012-07-11 NOTE — Telephone Encounter (Signed)
Request for Paxel. Last OV was 05/15/11 for Anxiety scheduled upcoming appt for 11/13/12. Ok to refill?

## 2012-08-02 ENCOUNTER — Ambulatory Visit (INDEPENDENT_AMBULATORY_CARE_PROVIDER_SITE_OTHER): Payer: Medicare Other | Admitting: Family Medicine

## 2012-08-02 ENCOUNTER — Encounter: Payer: Self-pay | Admitting: Family Medicine

## 2012-08-02 VITALS — BP 110/62 | HR 89 | Temp 98.1°F | Ht 67.0 in | Wt 144.8 lb

## 2012-08-02 DIAGNOSIS — Z7189 Other specified counseling: Secondary | ICD-10-CM

## 2012-08-02 NOTE — Progress Notes (Signed)
Subjective:    Patient ID: Nancy Herrera, female    DOB: [birth year], 71 y.o.   MRN: [id number]  HPI Going for dental work - has to pull 4 teeth at one time  Going to have to do a denture  No hx of heart problems  No longer on nsaid   Will just use local anethesia   Is interested in shingles vaccine   bp is good  BP Readings from Last 3 Encounters:  08/02/12 110/62  01/03/12 100/64  05/15/11 100/64      Lab Results  Component Value Date   CHOL 144 12/28/2011   CHOL 140 03/13/2011   CHOL 150 08/23/2010   Lab Results  Component Value Date   HDL 32.60* 12/28/2011   HDL 78.29 03/13/2011   HDL 56.21* 08/23/2010   Lab Results  Component Value Date   LDLCALC 88 12/28/2011   LDLCALC 84 03/13/2011   LDLCALC 100* 08/23/2010   Lab Results  Component Value Date   TRIG 116.0 12/28/2011   TRIG 73.0 03/13/2011   TRIG 80.0 08/23/2010   Lab Results  Component Value Date   CHOLHDL 4 12/28/2011   CHOLHDL 3 03/13/2011   CHOLHDL 4 08/23/2010   Lab Results  Component Value Date   LDLDIRECT 152.8 10/12/2009   LDLDIRECT 164.0 03/03/2009   LDLDIRECT 142.7 04/23/2007     Is feeling better- husband is feeling   Lab Results  Component Value Date   HGBA1C 6.5 12/28/2011     She has her f/u appts in nov   Patient Active Problem List  Diagnosis  . HYPERLIPIDEMIA  . ANXIETY  . SMOKER  . DEPRESSION  . HYPERTENSION  . ALLERGIC RHINITIS  . GERD  . DEGENERATIVE DISC DISEASE, LUMBOSACRAL SPINE  . HYPERGLYCEMIA  . SHOULDER PAIN, BILATERAL  . Adhesive capsulitis of shoulder  . UTI (lower urinary tract infection)  . Immunization counseling   Past Medical History  Diagnosis Date  . Hypertension   . Cancer   . Degenerative disc disease, lumbar   . Hyperlipidemia   . GERD (gastroesophageal reflux disease)   . Hyperglycemia     controlled by diet  . Anxiety   . Depression   . Allergy     allergic rhinitis   Past Surgical History  Procedure Date  . Cataract extraction    History    Substance Use Topics  . Smoking status: Current Everyday Smoker  . Smokeless tobacco: Not on file  . Alcohol Use: No   Family History  Problem Relation Age of Onset  . Cancer Mother     breast cancer  . Cancer Sister     breast cancer & lung cancer   No Known Allergies Current Outpatient Prescriptions on File Prior to Visit  Medication Sig Dispense Refill  . acetaminophen (TYLENOL) 500 MG tablet OTC as directed       . ALPRAZolam (XANAX) 0.5 MG tablet 1 tablet by mouth once daily as needed severe anxiety.  30 tablet  2  . diclofenac (VOLTAREN) 75 MG EC tablet Take 75 mg by mouth 2 (two) times daily.        Marland Kitchen glucose blood test strip To check glucose once daily and as needed for diabetes type 2       . glucose monitoring kit (FREESTYLE) monitoring kit To check glucose once daily and as needed for diabetes (250.0)       . Lancets MISC To check glucose once daily and as needed for  diabetes type 2       . lisinopril (PRINIVIL,ZESTRIL) 10 MG tablet TAKE 1 TABLET EVERY DAY  30 tablet  3  . PARoxetine (PAXIL) 40 MG tablet TAKE 1 TABLET EVERY DAY  90 tablet  2  . simvastatin (ZOCOR) 40 MG tablet Take 1 tablet (40 mg total) by mouth daily.  30 tablet  3  . aspirin 81 MG tablet Take 81 mg by mouth daily.             Review of Systems Review of Systems  Constitutional: Negative for fever, appetite change, fatigue and unexpected weight change.  Eyes: Negative for pain and visual disturbance.  Respiratory: Negative for cough and shortness of breath.   Cardiovascular: Negative for cp or palpitations    Gastrointestinal: Negative for nausea, diarrhea and constipation.  Genitourinary: Negative for urgency and frequency.  Skin: Negative for pallor or rash   Neurological: Negative for weakness, light-headedness, numbness and headaches.  Hematological: Negative for adenopathy. Does not bruise/bleed easily.  Psychiatric/Behavioral: Negative for dysphoric mood. The patient is anxious- but this  has improved from last visit          Objective:   Physical Exam  Constitutional: She appears well-developed and well-nourished. No distress.  HENT:  Head: Normocephalic and atraumatic.  Eyes: Conjunctivae and EOM are normal. Pupils are equal, round, and reactive to light.  Musculoskeletal: She exhibits no edema.  Neurological: She is alert.  Skin: Skin is warm and dry. No erythema.  Psychiatric: She has a normal mood and affect.       Less anxious today Talkative and calm          Assessment & Plan:

## 2012-08-02 NOTE — Patient Instructions (Addendum)
Our shingles vaccine is called zostavax  Ask up front about cost and payment without insurance coverage  I will check with [facility] about their price [facility] Let me know and we will figure out what would be most affordible for you

## 2012-08-03 NOTE — Assessment & Plan Note (Signed)
Disc pros and cons and risks of zoster vaccine and pt is very interested in getting one She is aware that her ins co does not pay -and this will be out of pocket Pt will check on price here and admin fee Then [facility] price to compare I will check with [facility] pharmacy  She will see what is affordable and let us know her choice

## 2012-08-04 ENCOUNTER — Other Ambulatory Visit: Payer: Self-pay | Admitting: Family Medicine

## 2012-08-05 ENCOUNTER — Other Ambulatory Visit: Payer: Self-pay

## 2012-08-05 MED ORDER — SIMVASTATIN 40 MG PO TABS: 40.0000 mg | ORAL_TABLET | Freq: Every day | ORAL | Status: DC

## 2012-08-22 ENCOUNTER — Other Ambulatory Visit: Payer: Self-pay | Admitting: Family Medicine

## 2012-09-18 ENCOUNTER — Ambulatory Visit: Payer: Medicare Other

## 2012-09-18 ENCOUNTER — Telehealth: Payer: Self-pay

## 2012-09-18 NOTE — Telephone Encounter (Signed)
Pt request shingles vaccine rx faxed to [facility] [address].Please advise.

## 2012-09-19 MED ORDER — ZOSTER VACCINE LIVE 19400 UNT/0.65ML ~~LOC~~ SOLR: 0.6500 mL | Freq: Once | SUBCUTANEOUS | Status: DC

## 2012-09-19 NOTE — Telephone Encounter (Signed)
Will send electronically Sometimes I have trouble getting this to go electronically- let me know if any problems

## 2012-09-19 NOTE — Telephone Encounter (Signed)
Advise pt that shingles vac. Was sent in to pharm.

## 2012-09-23 ENCOUNTER — Other Ambulatory Visit: Payer: Self-pay | Admitting: Family Medicine

## 2012-09-23 NOTE — Telephone Encounter (Signed)
Ok to refill 

## 2012-09-23 NOTE — Telephone Encounter (Signed)
Called in Rx as prescribed 

## 2012-09-23 NOTE — Telephone Encounter (Signed)
Px written for call in   

## 2012-11-13 ENCOUNTER — Other Ambulatory Visit: Payer: Medicare Other

## 2012-11-18 ENCOUNTER — Ambulatory Visit (INDEPENDENT_AMBULATORY_CARE_PROVIDER_SITE_OTHER): Payer: Medicare Other | Admitting: Family Medicine

## 2012-11-18 ENCOUNTER — Encounter: Payer: Self-pay | Admitting: Family Medicine

## 2012-11-18 VITALS — BP 136/72 | HR 87 | Temp 97.4°F | Ht 64.5 in | Wt 144.0 lb

## 2012-11-18 DIAGNOSIS — I10 Essential (primary) hypertension: Secondary | ICD-10-CM

## 2012-11-18 DIAGNOSIS — E785 Hyperlipidemia, unspecified: Secondary | ICD-10-CM

## 2012-11-18 DIAGNOSIS — F17200 Nicotine dependence, unspecified, uncomplicated: Secondary | ICD-10-CM

## 2012-11-18 DIAGNOSIS — F411 Generalized anxiety disorder: Secondary | ICD-10-CM

## 2012-11-18 DIAGNOSIS — Z1231 Encounter for screening mammogram for malignant neoplasm of breast: Secondary | ICD-10-CM

## 2012-11-18 DIAGNOSIS — R7309 Other abnormal glucose: Secondary | ICD-10-CM

## 2012-11-18 MED ORDER — LISINOPRIL 10 MG PO TABS: 10.0000 mg | ORAL_TABLET | Freq: Every day | ORAL | Status: DC

## 2012-11-18 MED ORDER — PAROXETINE HCL 40 MG PO TABS: 40.0000 mg | ORAL_TABLET | Freq: Every day | ORAL | Status: DC

## 2012-11-18 MED ORDER — SIMVASTATIN 40 MG PO TABS: 40.0000 mg | ORAL_TABLET | Freq: Every day | ORAL | Status: DC

## 2012-11-18 NOTE — Assessment & Plan Note (Signed)
a1c today Disc low glycemic diet Wt is stable

## 2012-11-18 NOTE — Assessment & Plan Note (Signed)
Lab today Has been well controlled with simvastatin and diet  Rev low sat fat diet

## 2012-11-18 NOTE — Assessment & Plan Note (Signed)
bp in fair control at this time  No changes needed  Disc lifstyle change with low sodium diet and exercise   Labs today 

## 2012-11-18 NOTE — Patient Instructions (Addendum)
You need a Tdap vaccine before the baby comes to protect you from whooping cough (pertussis)- check with your insurance and the health dept Labs today  We will schedule mammogram at check out  Stay active Eat a healthy diet  Take care of yourself

## 2012-11-18 NOTE — Assessment & Plan Note (Signed)
Will continue paxil with prn xanax (with caution) Fairly stable  Enc exercise/ staying active

## 2012-11-18 NOTE — Assessment & Plan Note (Signed)
Scheduled annual screening mammogram Nl breast exam today  Encouraged monthly self exams   

## 2012-11-18 NOTE — Assessment & Plan Note (Signed)
Pt has gone entirely to E -cig now and is breathing better  Commended on that  Next step is to graduate down on nicotine dose -will work on that

## 2012-11-18 NOTE — Progress Notes (Signed)
Subjective:    Patient ID: Nancy Herrera, female    DOB: [birth year], 71 y.o.   MRN: [id number]  HPI Here for check up of chronic medical conditions and to review health mt list    Is doing well overall  Excited about a new grandchild  coming  Wt is stable with bmi of 24  bp is stable today  No cp or palpitations or headaches or edema  No side effects to medicines  BP Readings from Last 3 Encounters:  11/18/12 136/72  08/02/12 110/62  01/03/12 100/64     Due for lipid check and overall labs On simvastain Lab Results  Component Value Date   CHOL 144 12/28/2011   CHOL 140 03/13/2011   CHOL 150 08/23/2010   Lab Results  Component Value Date   HDL 32.60* 12/28/2011   HDL 40.98 03/13/2011   HDL 11.91* 08/23/2010   Lab Results  Component Value Date   LDLCALC 88 12/28/2011   LDLCALC 84 03/13/2011   LDLCALC 100* 08/23/2010   Lab Results  Component Value Date   TRIG 116.0 12/28/2011   TRIG 73.0 03/13/2011   TRIG 80.0 08/23/2010   Lab Results  Component Value Date   CHOLHDL 4 12/28/2011   CHOLHDL 3 03/13/2011   CHOLHDL 4 08/23/2010   Lab Results  Component Value Date   LDLDIRECT 152.8 10/12/2009   LDLDIRECT 164.0 03/03/2009   LDLDIRECT 142.7 04/23/2007   ate grits today and drank a coke   Tobacco status- is smoking the electronic cigarettes now  Several times per day- unsure of what nicotine dose  Is getting used to that   Mood/ anx/ depression Anxiety is stable - she takes her paxil daily- xanax is prn  Not depressed feeling at all  Is a worrier   Flu vaccine= got in nov   Td-thinks she had one in 09 at work  Needs Tdap before baby is born    Colon cancer screen not interested in one  Does not want to do stool card   Zoster status --had it at the pharmacy - end of oct   Breast cancer screen Has not had her mammogram for this year  Would like to go to the breast center in [place]   Used to see gyn - he retired Last pap 1 year ago   Patient Active Problem List  Diagnosis   . HYPERLIPIDEMIA  . ANXIETY  . SMOKER  . DEPRESSION  . HYPERTENSION  . ALLERGIC RHINITIS  . GERD  . DEGENERATIVE DISC DISEASE, LUMBOSACRAL SPINE  . HYPERGLYCEMIA  . SHOULDER PAIN, BILATERAL  . Adhesive capsulitis of shoulder  . UTI (lower urinary tract infection)  . Immunization counseling   Past Medical History  Diagnosis Date  . Hypertension   . Cancer   . Degenerative disc disease, lumbar   . Hyperlipidemia   . GERD (gastroesophageal reflux disease)   . Hyperglycemia     controlled by diet  . Anxiety   . Depression   . Allergy     allergic rhinitis   Past Surgical History  Procedure Date  . Cataract extraction    History  Substance Use Topics  . Smoking status: Current Every Day Smoker  . Smokeless tobacco: Not on file     Comment: smokes E cigarettes  . Alcohol Use: No   Family History  Problem Relation Age of Onset  . Cancer Mother     breast cancer  . Cancer Sister  breast cancer & lung cancer   No Known Allergies Current Outpatient Prescriptions on File Prior to Visit  Medication Sig Dispense Refill  . acetaminophen (TYLENOL) 500 MG tablet OTC as directed       . ALPRAZolam (XANAX) 0.5 MG tablet TAKE 1 TABLET BY MOUTH EVERY DAY AS NEEDED FOR SEVERE ANXIETY  30 tablet  2  . aspirin 81 MG tablet Take 81 mg by mouth daily.        Marland Kitchen glucose blood test strip To check glucose once daily and as needed for diabetes type 2       . glucose monitoring kit (FREESTYLE) monitoring kit To check glucose once daily and as needed for diabetes (250.0)       . Lancets MISC To check glucose once daily and as needed for diabetes type 2       . lisinopril (PRINIVIL,ZESTRIL) 10 MG tablet TAKE 1 TABLET EVERY DAY  90 tablet  0  . PARoxetine (PAXIL) 40 MG tablet TAKE 1 TABLET EVERY DAY  90 tablet  2  . simvastatin (ZOCOR) 40 MG tablet Take 1 tablet (40 mg total) by mouth daily.  30 tablet  3        Review of Systems Review of Systems  Constitutional: Negative for  fever, appetite change, fatigue and unexpected weight change.  Eyes: Negative for pain and visual disturbance.  Respiratory: Negative for cough and shortness of breath.   Cardiovascular: Negative for cp or palpitations    Gastrointestinal: Negative for nausea, diarrhea and constipation.  Genitourinary: Negative for urgency and frequency.  Skin: Negative for pallor or rash   Neurological: Negative for weakness, light-headedness, numbness and headaches.  Hematological: Negative for adenopathy. Does not bruise/bleed easily.  Psychiatric/Behavioral: Negative for dysphoric mood. The patient is not nervous/anxious.         Objective:   Physical Exam  Constitutional: She appears well-developed and well-nourished. No distress.  HENT:  Head: Normocephalic and atraumatic.  Right Ear: External ear normal.  Left Ear: External ear normal.  Nose: Nose normal.  Mouth/Throat: Oropharynx is clear and moist.  Eyes: Conjunctivae normal and EOM are normal. Pupils are equal, round, and reactive to light. Right eye exhibits no discharge. Left eye exhibits no discharge. No scleral icterus.  Neck: Normal range of motion. Neck supple. No JVD present. Carotid bruit is not present. No thyromegaly present.  Cardiovascular: Normal rate, regular rhythm, normal heart sounds and intact distal pulses.  Exam reveals no gallop.   Pulmonary/Chest: Effort normal and breath sounds normal. No respiratory distress. She has no wheezes.  Abdominal: Soft. Bowel sounds are normal. She exhibits no distension, no abdominal bruit and no mass. There is no tenderness.  Genitourinary: No breast swelling, tenderness, discharge or bleeding.       Breast exam: No mass, nodules, thickening, tenderness, bulging, retraction, inflamation, nipple discharge or skin changes noted.  No axillary or clavicular LA.  Chaperoned exam.    Musculoskeletal: Normal range of motion. She exhibits no edema and no tenderness.  Lymphadenopathy:    She has no  cervical adenopathy.  Neurological: She is alert. She has normal reflexes. No cranial nerve deficit. She exhibits normal muscle tone. Coordination normal.  Skin: Skin is warm and dry. No rash noted. No erythema. No pallor.  Psychiatric: She has a normal mood and affect.          Assessment & Plan:

## 2012-11-19 LAB — CBC WITH DIFFERENTIAL/PLATELET
Basophils Absolute: 0 10*3/uL (ref 0.0–0.1)
Basophils Relative: 0.4 % (ref 0.0–3.0)
Eosinophils Absolute: 0.1 10*3/uL (ref 0.0–0.7)
Lymphocytes Relative: 26.1 % (ref 12.0–46.0)
MCHC: 31.3 g/dL (ref 30.0–36.0)
Neutrophils Relative %: 67.1 % (ref 43.0–77.0)
Platelets: 264 10*3/uL (ref 150.0–400.0)
RBC: 5.61 Mil/uL — ABNORMAL HIGH (ref 3.87–5.11)

## 2012-11-19 LAB — COMPREHENSIVE METABOLIC PANEL
ALT: 9 U/L (ref 0–35)
AST: 17 U/L (ref 0–37)
Albumin: 3.7 g/dL (ref 3.5–5.2)
BUN: 8 mg/dL (ref 6–23)
Calcium: 8.8 mg/dL (ref 8.4–10.5)
Chloride: 96 mEq/L (ref 96–112)
Potassium: 4.1 mEq/L (ref 3.5–5.1)
Sodium: 135 mEq/L (ref 135–145)
Total Protein: 7.4 g/dL (ref 6.0–8.3)

## 2012-11-19 LAB — LIPID PANEL
HDL: 32.3 mg/dL — ABNORMAL LOW (ref >39.00)
LDL Cholesterol: 79 mg/dL (ref 0–99)
VLDL: 28.6 mg/dL (ref 0.0–40.0)

## 2012-11-26 ENCOUNTER — Ambulatory Visit (INDEPENDENT_AMBULATORY_CARE_PROVIDER_SITE_OTHER): Payer: Medicare Other | Admitting: *Deleted

## 2012-11-26 DIAGNOSIS — Z23 Encounter for immunization: Secondary | ICD-10-CM

## 2012-12-27 ENCOUNTER — Ambulatory Visit
Admission: RE | Admit: 2012-12-27 | Discharge: 2012-12-27 | Disposition: A | Discharge status: Home / Self Care | Payer: Medicare Other | Source: Ambulatory Visit | Attending: Family Medicine | Admitting: Family Medicine

## 2012-12-27 DIAGNOSIS — Z1231 Encounter for screening mammogram for malignant neoplasm of breast: Secondary | ICD-10-CM

## 2012-12-30 ENCOUNTER — Encounter: Payer: Self-pay | Admitting: *Deleted

## 2013-03-03 ENCOUNTER — Other Ambulatory Visit: Payer: Self-pay | Admitting: Family Medicine

## 2013-03-03 NOTE — Telephone Encounter (Signed)
Px written for call in   

## 2013-03-03 NOTE — Telephone Encounter (Signed)
Ok to refill 

## 2013-03-04 NOTE — Telephone Encounter (Signed)
Rx called into pharmacy as prescribed  

## 2013-05-13 ENCOUNTER — Encounter: Payer: Self-pay | Admitting: Radiology

## 2013-05-14 ENCOUNTER — Other Ambulatory Visit (INDEPENDENT_AMBULATORY_CARE_PROVIDER_SITE_OTHER): Payer: Medicare Other

## 2013-05-14 DIAGNOSIS — R7309 Other abnormal glucose: Secondary | ICD-10-CM

## 2013-05-14 DIAGNOSIS — E785 Hyperlipidemia, unspecified: Secondary | ICD-10-CM

## 2013-05-14 DIAGNOSIS — K219 Gastro-esophageal reflux disease without esophagitis: Secondary | ICD-10-CM

## 2013-05-14 DIAGNOSIS — I10 Essential (primary) hypertension: Secondary | ICD-10-CM

## 2013-05-14 LAB — LIPID PANEL
Cholesterol: 138 mg/dL (ref 0–200)
HDL: 32.3 mg/dL — ABNORMAL LOW (ref >39.00)
Triglycerides: 113 mg/dL (ref 0.0–149.0)
VLDL: 22.6 mg/dL (ref 0.0–40.0)

## 2013-05-14 LAB — HEMOGLOBIN A1C: Hgb A1c MFr Bld: 6.4 % (ref 4.6–6.5)

## 2013-05-14 LAB — COMPREHENSIVE METABOLIC PANEL
BUN: 11 mg/dL (ref 6–23)
CO2: 32 mEq/L (ref 19–32)
Calcium: 8.8 mg/dL (ref 8.4–10.5)
Chloride: 99 mEq/L (ref 96–112)
Creatinine, Ser: 0.9 mg/dL (ref 0.4–1.2)
GFR: 62.78 mL/min (ref >60.00)
Total Bilirubin: 0.2 mg/dL — ABNORMAL LOW (ref 0.3–1.2)

## 2013-05-14 LAB — CBC WITH DIFFERENTIAL/PLATELET
Basophils Relative: 0.4 % (ref 0.0–3.0)
HCT: 50.2 % — ABNORMAL HIGH (ref 36.0–46.0)
Hemoglobin: 16 g/dL — ABNORMAL HIGH (ref 12.0–15.0)
Lymphocytes Relative: 27.7 % (ref 12.0–46.0)
Lymphs Abs: 2.3 10*3/uL (ref 0.7–4.0)
MCHC: 32 g/dL (ref 30.0–36.0)
Monocytes Relative: 5.9 % (ref 3.0–12.0)
Neutro Abs: 5.3 10*3/uL (ref 1.4–7.7)
RBC: 6.03 Mil/uL — ABNORMAL HIGH (ref 3.87–5.11)
RDW: 17.7 % — ABNORMAL HIGH (ref 11.5–14.6)

## 2013-05-21 ENCOUNTER — Ambulatory Visit: Payer: Medicare Other | Admitting: Family Medicine

## 2013-05-26 ENCOUNTER — Encounter: Payer: Self-pay | Admitting: Family Medicine

## 2013-05-26 ENCOUNTER — Ambulatory Visit (INDEPENDENT_AMBULATORY_CARE_PROVIDER_SITE_OTHER)
Admission: RE | Admit: 2013-05-26 | Discharge: 2013-05-26 | Disposition: A | Discharge status: Home / Self Care | Payer: Medicare Other | Source: Ambulatory Visit | Attending: Family Medicine | Admitting: Family Medicine

## 2013-05-26 ENCOUNTER — Ambulatory Visit (INDEPENDENT_AMBULATORY_CARE_PROVIDER_SITE_OTHER): Payer: Medicare Other | Admitting: Family Medicine

## 2013-05-26 VITALS — BP 122/70 | HR 83 | Temp 98.4°F | Ht 64.5 in | Wt 146.2 lb

## 2013-05-26 DIAGNOSIS — I10 Essential (primary) hypertension: Secondary | ICD-10-CM

## 2013-05-26 DIAGNOSIS — F17200 Nicotine dependence, unspecified, uncomplicated: Secondary | ICD-10-CM

## 2013-05-26 DIAGNOSIS — R7309 Other abnormal glucose: Secondary | ICD-10-CM

## 2013-05-26 DIAGNOSIS — E785 Hyperlipidemia, unspecified: Secondary | ICD-10-CM

## 2013-05-26 NOTE — Progress Notes (Signed)
Subjective:    Patient ID: Nancy Herrera, female    DOB: August 06, 1941, 72 y.o.   MRN: [id number]  HPI Here for f/u of chronic medical problems   Is feeling ok  Babysitting grand kids    Wt is up 2 lb  Is trying to eat healthy - lots of vegetables and chicken   Smoking status - mostly E - cig  Some regular cigarettes - 1/2 pack per week  Pulse ox after walking was 85% today- better after 5 min of rest 89-90% Breathing- in general is ok , little cough in am (smoker's cough)  No uri illnesses   bp is stable today  No cp or palpitations or headaches or edema  No side effects to medicines  BP Readings from Last 3 Encounters:  05/26/13 122/70  11/18/12 136/72  08/02/12 110/62      Hyperlipidemia On zocor and diet  Lab Results  Component Value Date   CHOL 138 05/14/2013   CHOL 140 11/18/2012   CHOL 144 12/28/2011   Lab Results  Component Value Date   HDL 32.30* 05/14/2013   HDL 32.30* 11/18/2012   HDL 32.60* 12/28/2011   Lab Results  Component Value Date   LDLCALC 83 05/14/2013   LDLCALC 79 11/18/2012   LDLCALC 88 12/28/2011   Lab Results  Component Value Date   TRIG 113.0 05/14/2013   TRIG 143.0 11/18/2012   TRIG 116.0 12/28/2011   Lab Results  Component Value Date   CHOLHDL 4 05/14/2013   CHOLHDL 4 11/18/2012   CHOLHDL 4 12/28/2011   Lab Results  Component Value Date   LDLDIRECT 152.8 10/12/2009   LDLDIRECT 164.0 03/03/2009   LDLDIRECT 142.7 04/23/2007   is active  Does not take fish oil   Hyperglycemia  Lab Results  Component Value Date   HGBA1C 6.4 05/14/2013   This is unchanged Diet- overall ok   tsh good  Lab Results  Component Value Date   TSH 1.42 05/14/2013    Review of Systems Review of Systems  Constitutional: Negative for fever, appetite change, fatigue and unexpected weight change.  Eyes: Negative for pain and visual disturbance.  Respiratory: pos for smokers cough and also some sob on exertion, neg for wheeze  Cardiovascular: Negative for cp or  palpitations    Gastrointestinal: Negative for nausea, diarrhea and constipation.  Genitourinary: Negative for urgency and frequency.  Skin: Negative for pallor or rash   Neurological: Negative for weakness, light-headedness, numbness and headaches.  Hematological: Negative for adenopathy. Does not bruise/bleed easily.  Psychiatric/Behavioral: Negative for dysphoric mood. The patient is not nervous/anxious.         Objective:   Physical Exam  Constitutional: She appears well-developed and well-nourished. No distress.  HENT:  Head: Normocephalic and atraumatic.  Right Ear: External ear normal.  Left Ear: External ear normal.  Mouth/Throat: Oropharynx is clear and moist.  Eyes: Conjunctivae and EOM are normal. Pupils are equal, round, and reactive to light. No scleral icterus.  Neck: Normal range of motion. Neck supple. No JVD present. Carotid bruit is not present. No thyromegaly present.  Cardiovascular: Normal rate, regular rhythm, normal heart sounds and intact distal pulses.  Exam reveals no gallop.   Pulmonary/Chest: Effort normal and breath sounds normal. No respiratory distress. She has no wheezes. She has no rales.  Diffusely distant bs   Abdominal: Soft. Bowel sounds are normal. She exhibits no distension, no abdominal bruit and no mass. There is no tenderness.  Musculoskeletal: She exhibits  no edema and no tenderness.  Lymphadenopathy:    She has no cervical adenopathy.  Neurological: She is alert. She has normal reflexes. No cranial nerve deficit. She exhibits normal muscle tone. Coordination normal.  Skin: Skin is warm and dry. No rash noted. No erythema. No pallor.  Psychiatric: She has a normal mood and affect.          Assessment & Plan:

## 2013-05-26 NOTE — Assessment & Plan Note (Signed)
Disc goals for lipids and reasons to control them Rev labs with pt Rev low sat fat diet in detail Will try fish oil and exercise to inc HDL

## 2013-05-26 NOTE — Assessment & Plan Note (Signed)
Part e cig and 1/2 pp week of cigarettes Pulse ox after ambulation was 85%- at rest 5 min is 89% with no sob cxr today Urged to quit asap Disc in detail risks of smoking and possible outcomes including copd, vascular/ heart disease, cancer , respiratory and sinus infections  Pt voices understanding

## 2013-05-26 NOTE — Assessment & Plan Note (Signed)
Lab Results  Component Value Date   HGBA1C 6.4 05/14/2013   this is stable Rev low glycemic diet

## 2013-05-26 NOTE — Assessment & Plan Note (Signed)
bp in fair control at this time  No changes needed  Disc lifstyle change with low sodium diet and exercise   

## 2013-05-26 NOTE — Patient Instructions (Addendum)
Try your best to change entirely to electronic cigarette and then wean off that from there Due to low oxygen saturation today- we will do a chest xray  Labs are stable- watch diet , try some fish oil to increase your good cholesterol Follow up in 6 months for annual exam with labs prior

## 2013-06-09 ENCOUNTER — Encounter: Payer: Self-pay | Admitting: Family Medicine

## 2013-06-26 ENCOUNTER — Other Ambulatory Visit: Payer: Self-pay | Admitting: Family Medicine

## 2013-06-26 NOTE — Telephone Encounter (Signed)
Electronic refill request, please advise  

## 2013-06-26 NOTE — Telephone Encounter (Signed)
Px written for call in   

## 2013-06-26 NOTE — Telephone Encounter (Signed)
Rx called in as prescribed 

## 2013-08-14 ENCOUNTER — Other Ambulatory Visit: Payer: Self-pay | Admitting: Family Medicine

## 2013-11-26 ENCOUNTER — Telehealth: Payer: Self-pay | Admitting: Family Medicine

## 2013-11-26 NOTE — Telephone Encounter (Signed)
That is fine - just please do not overload me -- I think I did 7 physicals today and that just does not leave any time for urgent care, thanks

## 2013-11-26 NOTE — Telephone Encounter (Signed)
Pt had to cancel her CPE apptmt that she had scheduled for Friday, 12/24/2024due to a death in her family (her sister).  Your next available CPE will not be until April 21st.  Can you accommodate pt a sooner apptmt (prefereably a Friday) for her CPE than April? Thank you.

## 2013-11-27 NOTE — Telephone Encounter (Signed)
Pt scheduled for 01/23/2013

## 2013-11-28 ENCOUNTER — Encounter: Payer: Medicare Other | Admitting: Family Medicine

## 2014-01-14 ENCOUNTER — Other Ambulatory Visit: Payer: Self-pay | Admitting: Family Medicine

## 2014-01-14 NOTE — Telephone Encounter (Signed)
Px written for call in   

## 2014-01-14 NOTE — Telephone Encounter (Signed)
Rx called in as prescribed 

## 2014-01-14 NOTE — Telephone Encounter (Signed)
Electronic refill request, please advise  

## 2014-01-16 ENCOUNTER — Other Ambulatory Visit: Payer: Self-pay

## 2014-01-16 DIAGNOSIS — Z1231 Encounter for screening mammogram for malignant neoplasm of breast: Secondary | ICD-10-CM

## 2014-01-23 ENCOUNTER — Encounter: Payer: Medicare Other | Admitting: Family Medicine

## 2014-02-01 ENCOUNTER — Other Ambulatory Visit: Payer: Self-pay | Admitting: Family Medicine

## 2014-02-06 ENCOUNTER — Ambulatory Visit
Admission: RE | Admit: 2014-02-06 | Discharge: 2014-02-06 | Disposition: A | Discharge status: Home / Self Care | Payer: Medicare Other | Source: Ambulatory Visit

## 2014-02-06 DIAGNOSIS — Z1231 Encounter for screening mammogram for malignant neoplasm of breast: Secondary | ICD-10-CM

## 2014-02-11 ENCOUNTER — Encounter: Payer: Self-pay | Admitting: *Deleted

## 2014-02-25 ENCOUNTER — Other Ambulatory Visit: Payer: Self-pay | Admitting: Family Medicine

## 2014-02-25 NOTE — Telephone Encounter (Signed)
Left voicemail requesting pt to call office back 

## 2014-02-25 NOTE — Telephone Encounter (Signed)
Please ask pt to remind me what she is taking it for?

## 2014-02-25 NOTE — Telephone Encounter (Signed)
Please refill times one  

## 2014-02-25 NOTE — Telephone Encounter (Signed)
Electronic refill request, please advise  

## 2014-02-25 NOTE — Telephone Encounter (Signed)
Pt said she want's a refill of this for her "nerves" I asked pt is she nauseous, and pt said sometimes but not often she wants it refilled to help her "nerves"

## 2014-02-26 NOTE — Telephone Encounter (Signed)
done

## 2014-03-09 ENCOUNTER — Ambulatory Visit (INDEPENDENT_AMBULATORY_CARE_PROVIDER_SITE_OTHER): Payer: Medicare Other | Admitting: Family Medicine

## 2014-03-09 ENCOUNTER — Ambulatory Visit (INDEPENDENT_AMBULATORY_CARE_PROVIDER_SITE_OTHER)
Admission: RE | Admit: 2014-03-09 | Discharge: 2014-03-09 | Disposition: A | Discharge status: Home / Self Care | Payer: Medicare Other | Source: Ambulatory Visit | Attending: Family Medicine | Admitting: Family Medicine

## 2014-03-09 ENCOUNTER — Encounter: Payer: Self-pay | Admitting: Family Medicine

## 2014-03-09 VITALS — BP 132/76 | HR 84 | Temp 97.8°F | Ht 64.5 in | Wt 148.5 lb

## 2014-03-09 DIAGNOSIS — I10 Essential (primary) hypertension: Secondary | ICD-10-CM

## 2014-03-09 DIAGNOSIS — R600 Localized edema: Secondary | ICD-10-CM | POA: Insufficient documentation

## 2014-03-09 DIAGNOSIS — F17200 Nicotine dependence, unspecified, uncomplicated: Secondary | ICD-10-CM

## 2014-03-09 DIAGNOSIS — R609 Edema, unspecified: Secondary | ICD-10-CM

## 2014-03-09 LAB — COMPREHENSIVE METABOLIC PANEL
ALBUMIN: 3.7 g/dL (ref 3.5–5.2)
ALT: 14 U/L (ref 0–35)
AST: 18 U/L (ref 0–37)
Alkaline Phosphatase: 70 U/L (ref 39–117)
BUN: 11 mg/dL (ref 6–23)
CALCIUM: 9.1 mg/dL (ref 8.4–10.5)
CHLORIDE: 99 meq/L (ref 96–112)
CO2: 36 mEq/L — ABNORMAL HIGH (ref 19–32)
Creatinine, Ser: 0.9 mg/dL (ref 0.4–1.2)
GFR: 61.87 mL/min (ref >60.00)
Glucose, Bld: 91 mg/dL (ref 70–99)
Potassium: 4.2 mEq/L (ref 3.5–5.1)
SODIUM: 141 meq/L (ref 135–145)
TOTAL PROTEIN: 7.8 g/dL (ref 6.0–8.3)
Total Bilirubin: 0.4 mg/dL (ref 0.3–1.2)

## 2014-03-09 LAB — TSH: TSH: 0.98 u[IU]/mL (ref 0.35–5.50)

## 2014-03-09 LAB — BRAIN NATRIURETIC PEPTIDE: PRO B NATRI PEPTIDE: 226 pg/mL — AB (ref 0.0–100.0)

## 2014-03-09 NOTE — Assessment & Plan Note (Signed)
No CHF symptoms Suspect recent heat and venous insuff is causing this  Lab incl BNP today  Pt will try knee highs with support May consider diuretic

## 2014-03-09 NOTE — Progress Notes (Signed)
Subjective:    Patient ID: Nancy Herrera, female    DOB: March 22, 1941, 73 y.o.   MRN: [id number]  HPI Here with swollen feet  About 2 weeks  Worse on the L but now on both sides  Not very uncomfortable   Nothing changed-diet or medicines  No change in processed foods/ salt / or eating out   No more sitting than usual  No new exercise   No more sob than usual  No chest pain  No PND  No orthopnea    Pulse ox is 87% at rest today- smoker  Smokes 1/2 ppd  She was doing well until she lost her sister (cancer - stomach- breast)    Patient Active Problem List   Diagnosis Date Noted  . Other screening mammogram 11/18/2012  . Immunization counseling 08/02/2012  . 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 SHOULDER PAIN, BILATERAL 12/06/2010  . ALLERGIC RHINITIS 08/23/2010  . HYPERGLYCEMIA 08/23/2010  . HYPERLIPIDEMIA 04/10/2007  . ANXIETY 04/10/2007  . SMOKER 04/10/2007  . DEPRESSION 04/10/2007  . HYPERTENSION 04/10/2007  . GERD 04/10/2007  . DEGENERATIVE DISC DISEASE, LUMBOSACRAL SPINE 04/10/2007   Past Medical History  Diagnosis Date  . Hypertension   . Cancer   . Degenerative disc disease, lumbar   . Hyperlipidemia   . GERD (gastroesophageal reflux disease)   . Hyperglycemia     controlled by diet  . Anxiety   . Depression   . Allergy     allergic rhinitis   Past Surgical History  Procedure Laterality Date  . Cataract extraction     History  Substance Use Topics  . Smoking status: Current Every Day Smoker -- 0.50 packs/day    Types: Cigarettes  . Smokeless tobacco: Not on file     Comment: smokes E cigarettes  . Alcohol Use: No   Family History  Problem Relation Age of Onset  . Cancer Mother     breast cancer  . Cancer Sister     breast cancer & lung cancer   No Known Allergies Current Outpatient Prescriptions on File Prior to Visit  Medication Sig Dispense Refill  . acetaminophen (TYLENOL) 500 MG tablet OTC as directed       . ALPRAZolam  (XANAX) 0.5 MG tablet TAKE 1 TABLET BY MOUTH EVERY DAY AS NEEDED FOR ANXIETY  30 tablet  5  . aspirin 81 MG tablet Take 81 mg by mouth daily.        Marland Kitchen glucose blood test strip To check glucose once daily and as needed for diabetes type 2       . glucose monitoring kit (FREESTYLE) monitoring kit To check glucose once daily and as needed for diabetes (250.0)       . Lancets MISC To check glucose once daily and as needed for diabetes type 2       . lisinopril (PRINIVIL,ZESTRIL) 10 MG tablet TAKE 1 TABLET (10 MG TOTAL) BY MOUTH DAILY.  90 tablet  1  . PARoxetine (PAXIL) 40 MG tablet Take 1 tablet (40 mg total) by mouth daily.  90 tablet  3  . promethazine (PHENERGAN) 25 MG tablet TAKE 1 TABLET EVERY 8 HOURS AS NEEDED FOR NAUSEA  30 tablet  0  . simvastatin (ZOCOR) 40 MG tablet TAKE 1 TABLET (40 MG TOTAL) BY MOUTH DAILY.  90 tablet  1   No current facility-administered medications on file prior to visit.     Review of Systems Review of Systems  Constitutional:  Negative for fever, appetite change, fatigue and unexpected weight change.  Eyes: Negative for pain and visual disturbance.  Respiratory: Negative for cough and shortness of breath.  neg for wheeze  Cardiovascular: Negative for cp or palpitations   neg for PND or orthopnea / pos for pedal edema  Gastrointestinal: Negative for nausea, diarrhea and constipation.  Genitourinary: Negative for urgency and frequency.  Skin: Negative for pallor or rash   Neurological: Negative for weakness, light-headedness, numbness and headaches.  Hematological: Negative for adenopathy. Does not bruise/bleed easily.  Psychiatric/Behavioral: Negative for dysphoric mood. The patient is not nervous/anxious.  pos for stressors        Objective:   Physical Exam  Constitutional: She appears well-developed and well-nourished.  HENT:  Head: Normocephalic and atraumatic.  Mouth/Throat: Oropharynx is clear and moist.  Eyes: Conjunctivae and EOM are normal. Pupils  are equal, round, and reactive to light. No scleral icterus.  Neck: Normal range of motion. Neck supple. No JVD present. Carotid bruit is not present. No thyromegaly present.  Cardiovascular: Normal rate, regular rhythm and intact distal pulses.  Exam reveals no gallop.   Pulmonary/Chest: Effort normal and breath sounds normal. No respiratory distress. She has no wheezes. She has no rales.  Diffusely distant bs   No crackles   Abdominal: Soft. Bowel sounds are normal. She exhibits no distension, no abdominal bruit and no mass. There is no tenderness.  Musculoskeletal: She exhibits edema. She exhibits no tenderness.  Trace to one plus pedal edema  slt pitting   No palp cords/calf tenderness Neg homan sign   Lymphadenopathy:    She has no cervical adenopathy.  Neurological: She is alert. She has normal reflexes.  Skin: Skin is warm and dry. No rash noted. No pallor.  Psychiatric: She has a normal mood and affect.          Assessment & Plan:

## 2014-03-09 NOTE — Assessment & Plan Note (Signed)
bp in fair control at this time  BP Readings from Last 1 Encounters:  03/09/14 132/76   No changes needed Disc lifstyle change with low sodium diet and exercise

## 2014-03-09 NOTE — Assessment & Plan Note (Signed)
Disc in detail risks of smoking and possible outcomes including copd, vascular/ heart disease, cancer , respiratory and sinus infections  Pt voices understanding Disc likely copd and need to quit  She is not ready yet but will contemplate it

## 2014-03-09 NOTE — Progress Notes (Signed)
Pre visit review using our clinic review tool, if applicable. No additional management support is needed unless otherwise documented below in the visit note. 

## 2014-03-09 NOTE — Patient Instructions (Signed)
Blood work today  Chest xray today  Avoid a lot of salt (sodium)  Elevate your legs and feet whenever you can  You can buy some knee high support stockings over the counter and wear them during the day  We will make a plan when labs return  Think hard about quitting smoking - I think your lungs are starting to suffer

## 2014-03-19 ENCOUNTER — Other Ambulatory Visit: Payer: Self-pay | Admitting: Family Medicine

## 2014-04-02 ENCOUNTER — Telehealth: Payer: Self-pay

## 2014-04-02 MED ORDER — ALPRAZOLAM 0.5 MG PO TABS: 0.5000 mg | ORAL_TABLET | Freq: Two times a day (BID) | ORAL | Status: DC | PRN

## 2014-04-02 NOTE — Telephone Encounter (Signed)
For the last 1-2 years has been prescribed once daily prn.  Recent increase in use.  Reviewing latest note, recently lost sister. Will send in 1 time increased dose to #40 this month but ask her to try and minimize use, as well as update [place] if nausea persistent as may need to be evaluated for this. plz phone in new xanax dose. To PCP as Juluis Rainier

## 2014-04-02 NOTE — Telephone Encounter (Signed)
Pt left v/m; pt request refill alprazolam to [facility]. Pt said she has been taking two daily due to nerves and nausea. Spoke with [facility] [address];pt has refills available for alprazolam but pt is requesting too early, [facility]14 and they will not refill until one day before rx is supposed to be refilled; med list has alprazolam 1 daily as needed.pt request cb.

## 2014-04-03 NOTE — Telephone Encounter (Signed)
Pt called re to alprazolam refill; pt notified as instructed and pt voiced understanding. Medication phoned to [facility] as instructed.

## 2014-04-24 ENCOUNTER — Other Ambulatory Visit: Payer: Self-pay | Admitting: Family Medicine

## 2014-04-24 NOTE — Telephone Encounter (Signed)
She can have 30 with 2 refills-thanks

## 2014-04-24 NOTE — Telephone Encounter (Signed)
Electronic refill request, please advise  

## 2014-04-24 NOTE — Telephone Encounter (Signed)
done

## 2014-05-14 ENCOUNTER — Other Ambulatory Visit: Payer: Self-pay | Admitting: Family Medicine

## 2014-05-14 NOTE — Telephone Encounter (Signed)
Px written for call in   

## 2014-05-14 NOTE — Telephone Encounter (Signed)
Ok to refill 

## 2014-05-14 NOTE — Telephone Encounter (Signed)
Rx called in as prescribed 

## 2014-07-17 ENCOUNTER — Ambulatory Visit (INDEPENDENT_AMBULATORY_CARE_PROVIDER_SITE_OTHER): Payer: Medicare Other | Admitting: Family Medicine

## 2014-07-17 ENCOUNTER — Other Ambulatory Visit: Payer: Self-pay | Admitting: Family Medicine

## 2014-07-17 ENCOUNTER — Encounter: Payer: Self-pay | Admitting: Family Medicine

## 2014-07-17 ENCOUNTER — Encounter: Payer: Self-pay | Admitting: *Deleted

## 2014-07-17 VITALS — BP 130/70 | HR 86 | Temp 97.2°F | Ht 64.25 in | Wt 144.5 lb

## 2014-07-17 DIAGNOSIS — Z1211 Encounter for screening for malignant neoplasm of colon: Secondary | ICD-10-CM

## 2014-07-17 DIAGNOSIS — F411 Generalized anxiety disorder: Secondary | ICD-10-CM

## 2014-07-17 DIAGNOSIS — Z23 Encounter for immunization: Secondary | ICD-10-CM

## 2014-07-17 DIAGNOSIS — I10 Essential (primary) hypertension: Secondary | ICD-10-CM

## 2014-07-17 DIAGNOSIS — F17209 Nicotine dependence, unspecified, with unspecified nicotine-induced disorders: Secondary | ICD-10-CM

## 2014-07-17 DIAGNOSIS — F1999 Other psychoactive substance use, unspecified with unspecified psychoactive substance-induced disorder: Secondary | ICD-10-CM

## 2014-07-17 DIAGNOSIS — E785 Hyperlipidemia, unspecified: Secondary | ICD-10-CM

## 2014-07-17 DIAGNOSIS — F17200 Nicotine dependence, unspecified, uncomplicated: Secondary | ICD-10-CM

## 2014-07-17 DIAGNOSIS — R7309 Other abnormal glucose: Secondary | ICD-10-CM

## 2014-07-17 DIAGNOSIS — Z0000 Encounter for general adult medical examination without abnormal findings: Secondary | ICD-10-CM

## 2014-07-17 NOTE — Assessment & Plan Note (Signed)
Due to age - given IFOB card instead of screening colonosc

## 2014-07-17 NOTE — Assessment & Plan Note (Signed)
Lipid panel today  On statin and diet  Disc low sat fat diet

## 2014-07-17 NOTE — Progress Notes (Signed)
Pre visit review using our clinic review tool, if applicable. No additional management support is needed unless otherwise documented below in the visit note. 

## 2014-07-17 NOTE — Assessment & Plan Note (Signed)
Reviewed health habits including diet and exercise and skin cancer prevention Reviewed appropriate screening tests for age  Also reviewed health mt list, fam hx and immunization status , as well as social and family history   See HPI prevnar vaccine today  Lab today
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
## 2014-07-17 NOTE — Assessment & Plan Note (Signed)
A1C today Disc low glycemic diet

## 2014-07-17 NOTE — Assessment & Plan Note (Signed)
Pt is back to 1/2ppd cigarettes  Disc in detail risks of smoking and possible outcomes including copd, vascular/ heart disease, cancer , respiratory and sinus infections  Pt voices understanding  She is considering trial of patches again

## 2014-07-17 NOTE — Patient Instructions (Signed)
prevnar pneumonia vaccine booster today  Work on an Scientist, physiological ( I gave you the packet on it)  Lab today and urine toxicology test  Work on quitting smoking  Start back on paxil -this helps anxiety and depression -- this helps minimize the amount of xanax you need  Take care of yourself

## 2014-07-17 NOTE — Telephone Encounter (Signed)
Last office visit 03/09/2014.  Last refilled 05/14/2014 for #60 with no refills.  Ok to refill?

## 2014-07-17 NOTE — Telephone Encounter (Signed)
Px written for call in   

## 2014-07-17 NOTE — Assessment & Plan Note (Signed)
bp in fair control at this time  BP Readings from Last 1 Encounters:  07/17/14 130/70   No changes needed Disc lifstyle change with low sodium diet and exercise

## 2014-07-17 NOTE — Assessment & Plan Note (Signed)
inst to get back on paxil 40  Xanax prn -was refilled today - trying to minimize that use  Reviewed stressors/ coping techniques/symptoms/ support sources/ tx options and side effects in detail today

## 2014-07-17 NOTE — Telephone Encounter (Signed)
Rx called in as prescribed 

## 2014-07-18 LAB — CBC WITH DIFFERENTIAL/PLATELET
BASOS PCT: 0 % (ref 0–1)
Basophils Absolute: 0 10*3/uL (ref 0.0–0.1)
Eosinophils Absolute: 0.2 10*3/uL (ref 0.0–0.7)
Eosinophils Relative: 2 % (ref 0–5)
HEMATOCRIT: 49.8 % — AB (ref 36.0–46.0)
Hemoglobin: 16.2 g/dL — ABNORMAL HIGH (ref 12.0–15.0)
Lymphocytes Relative: 26 % (ref 12–46)
Lymphs Abs: 2 10*3/uL (ref 0.7–4.0)
MCH: 26.1 pg (ref 26.0–34.0)
MCHC: 32.5 g/dL (ref 30.0–36.0)
MCV: 80.3 fL (ref 78.0–100.0)
MONO ABS: 0.5 10*3/uL (ref 0.1–1.0)
Monocytes Relative: 7 % (ref 3–12)
NEUTROS ABS: 5.1 10*3/uL (ref 1.7–7.7)
Neutrophils Relative %: 65 % (ref 43–77)
Platelets: 190 10*3/uL (ref 150–400)
RBC: 6.2 MIL/uL — ABNORMAL HIGH (ref 3.87–5.11)
RDW: 19.6 % — ABNORMAL HIGH (ref 11.5–15.5)
WBC: 7.8 10*3/uL (ref 4.0–10.5)

## 2014-07-18 LAB — COMPREHENSIVE METABOLIC PANEL
ALBUMIN: 3.9 g/dL (ref 3.5–5.2)
ALK PHOS: 76 U/L (ref 39–117)
ALT: 12 U/L (ref 0–35)
AST: 18 U/L (ref 0–37)
BUN: 11 mg/dL (ref 6–23)
CO2: 32 mEq/L (ref 19–32)
CREATININE: 0.86 mg/dL (ref 0.50–1.10)
Calcium: 8.9 mg/dL (ref 8.4–10.5)
Chloride: 96 mEq/L (ref 96–112)
GLUCOSE: 96 mg/dL (ref 70–99)
POTASSIUM: 4.7 meq/L (ref 3.5–5.3)
Sodium: 138 mEq/L (ref 135–145)
Total Bilirubin: 0.4 mg/dL (ref 0.2–1.2)
Total Protein: 6.8 g/dL (ref 6.0–8.3)

## 2014-07-18 LAB — HEMOGLOBIN A1C
HEMOGLOBIN A1C: 6.4 % — AB (ref <5.7)
MEAN PLASMA GLUCOSE: 137 mg/dL — AB (ref <117)

## 2014-07-18 LAB — LIPID PANEL
CHOL/HDL RATIO: 3.9 ratio
CHOLESTEROL: 149 mg/dL (ref 0–200)
HDL: 38 mg/dL — ABNORMAL LOW (ref >39)
LDL Cholesterol: 81 mg/dL (ref 0–99)
Triglycerides: 151 mg/dL — ABNORMAL HIGH (ref <150)
VLDL: 30 mg/dL (ref 0–40)

## 2014-07-18 LAB — TSH: TSH: 1.045 u[IU]/mL (ref 0.350–4.500)

## 2014-07-20 ENCOUNTER — Encounter: Payer: Self-pay | Admitting: *Deleted

## 2014-08-07 ENCOUNTER — Encounter: Payer: Self-pay | Admitting: Family Medicine

## 2014-08-10 ENCOUNTER — Other Ambulatory Visit: Payer: Self-pay | Admitting: Family Medicine

## 2014-09-01 ENCOUNTER — Other Ambulatory Visit: Payer: Self-pay | Admitting: Family Medicine

## 2014-09-01 NOTE — Telephone Encounter (Signed)
dine

## 2014-09-01 NOTE — Telephone Encounter (Signed)
Electronic refill request, please advise  

## 2014-09-01 NOTE — Telephone Encounter (Signed)
Please refill for a year  

## 2014-09-14 ENCOUNTER — Other Ambulatory Visit: Payer: Self-pay | Admitting: Family Medicine

## 2014-09-15 ENCOUNTER — Other Ambulatory Visit: Payer: Self-pay | Admitting: Family Medicine

## 2014-09-16 NOTE — Telephone Encounter (Signed)
Px written for call in   

## 2014-09-16 NOTE — Telephone Encounter (Signed)
Electronic refill request, please advise  

## 2014-09-16 NOTE — Telephone Encounter (Signed)
Rx called in as prescribed 

## 2014-12-21 ENCOUNTER — Encounter: Payer: Self-pay | Admitting: Family Medicine

## 2014-12-21 ENCOUNTER — Ambulatory Visit (INDEPENDENT_AMBULATORY_CARE_PROVIDER_SITE_OTHER): Payer: Medicare Other | Admitting: Family Medicine

## 2014-12-21 VITALS — BP 130/74 | HR 91 | Temp 98.2°F | Ht 64.25 in | Wt 145.8 lb

## 2014-12-21 DIAGNOSIS — R300 Dysuria: Secondary | ICD-10-CM

## 2014-12-21 DIAGNOSIS — N3000 Acute cystitis without hematuria: Secondary | ICD-10-CM

## 2014-12-21 DIAGNOSIS — N390 Urinary tract infection, site not specified: Secondary | ICD-10-CM | POA: Insufficient documentation

## 2014-12-21 LAB — POCT URINALYSIS DIPSTICK
BILIRUBIN UA: NEGATIVE
Glucose, UA: NEGATIVE
NITRITE UA: POSITIVE
Spec Grav, UA: 1.015
Urobilinogen, UA: 0.2
pH, UA: 6

## 2014-12-21 MED ORDER — CIPROFLOXACIN HCL 250 MG PO TABS: 250.0000 mg | ORAL_TABLET | Freq: Two times a day (BID) | ORAL | Status: DC

## 2014-12-21 NOTE — Assessment & Plan Note (Signed)
With severe dysuria  Cover with cipro Enc water intake  cx urine and update  Update if not starting to improve in several days or if worsening

## 2014-12-21 NOTE — Patient Instructions (Signed)
You have a uti Drink lots of water Take cipro as directed  We will alert you when urine culture result returns  Update if not starting to improve in a week or if worsening   Urinary Tract Infection Urinary tract infections (UTIs) can develop anywhere along your urinary tract. Your urinary tract is your body's drainage system for removing wastes and extra water. Your urinary tract includes two kidneys, two ureters, a bladder, and a urethra. Your kidneys are a pair of bean-shaped organs. Each kidney is about the size of your fist. They are located below your ribs, one on each side of your spine. CAUSES Infections are caused by microbes, which are microscopic organisms, including fungi, viruses, and bacteria. These organisms are so small that they can only be seen through a microscope. Bacteria are the microbes that most commonly cause UTIs. SYMPTOMS  Symptoms of UTIs may vary by age and gender of the patient and by the location of the infection. Symptoms in young women typically include a frequent and intense urge to urinate and a painful, burning feeling in the bladder or urethra during urination. Older women and men are more likely to be tired, shaky, and weak and have muscle aches and abdominal pain. A fever may mean the infection is in your kidneys. Other symptoms of a kidney infection include pain in your back or sides below the ribs, nausea, and vomiting. DIAGNOSIS To diagnose a UTI, your caregiver will ask you about your symptoms. Your caregiver also will ask to provide a urine sample. The urine sample will be tested for bacteria and white blood cells. White blood cells are made by your body to help fight infection. TREATMENT  Typically, UTIs can be treated with medication. Because most UTIs are caused by a bacterial infection, they usually can be treated with the use of antibiotics. The choice of antibiotic and length of treatment depend on your symptoms and the type of bacteria causing your  infection. HOME CARE INSTRUCTIONS  If you were prescribed antibiotics, take them exactly as your caregiver instructs you. Finish the medication even if you feel better after you have only taken some of the medication.  Drink enough water and fluids to keep your urine clear or pale yellow.  Avoid caffeine, tea, and carbonated beverages. They tend to irritate your bladder.  Empty your bladder often. Avoid holding urine for long periods of time.  Empty your bladder before and after sexual intercourse.  After a bowel movement, women should cleanse from front to back. Use each tissue only once. SEEK MEDICAL CARE IF:   You have back pain.  You develop a fever.  Your symptoms do not begin to resolve within 3 days. SEEK IMMEDIATE MEDICAL CARE IF:   You have severe back pain or lower abdominal pain.  You develop chills.  You have nausea or vomiting.  You have continued burning or discomfort with urination. MAKE SURE YOU:   Understand these instructions.  Will watch your condition.  Will get help right away if you are not doing well or get worse. Document Released: 09/20/2005 Document Revised: 06/11/2012 Document Reviewed: 01/19/2012 [facility] Patient Information 2015 [place], [place]. This information is not intended to replace advice given to you by your health care provider. Make sure you discuss any questions you have with your health care provider.

## 2014-12-21 NOTE — Progress Notes (Signed)
Pre visit review using our clinic review tool, if applicable. No additional management support is needed unless otherwise documented below in the visit note. 

## 2014-12-21 NOTE — Progress Notes (Signed)
Subjective:    Patient ID: Nancy Herrera, female    DOB: 27-Apr-1941, 73 y.o.   MRN: [id number]  HPI Here for urinary symptoms Symptoms started on Friday- then worse on Sat Hurting -severely to urinate  Frequency and urgency No blood in urine Some incontinence   Does not get utis often  No fever   Results for orders placed or performed in visit on 12/21/14  POCT urinalysis dipstick  Result Value Ref Range   Color, UA yellow    Clarity, UA cloudy    Glucose, UA neg.    Bilirubin, UA neg.    Ketones, UA small    Spec Grav, UA 1.015    Blood, UA Large    pH, UA 6.0    Protein, UA 100++    Urobilinogen, UA 0.2    Nitrite, UA Positive    Leukocytes, UA large (3+)       Chemistry      Component Value Date/Time   NA 138 07/17/2014 1523   K 4.7 07/17/2014 1523   CL 96 07/17/2014 1523   CO2 32 07/17/2014 1523   BUN 11 07/17/2014 1523   CREATININE 0.86 07/17/2014 1523   CREATININE 0.9 03/09/2014 1254      Component Value Date/Time   CALCIUM 8.9 07/17/2014 1523   ALKPHOS 76 07/17/2014 1523   AST 18 07/17/2014 1523   ALT 12 07/17/2014 1523   BILITOT 0.4 07/17/2014 1523       Review of Systems Review of Systems  Constitutional: Negative for fever, appetite change, fatigue and unexpected weight change.  Eyes: Negative for pain and visual disturbance.  Respiratory: Negative for cough and shortness of breath.   Cardiovascular: Negative for cp or palpitations    Gastrointestinal: Negative for nausea, diarrhea and constipation.  Genitourinary: posfor urgency and frequency. neg for hematuria or flank pain  Skin: Negative for pallor or rash   Neurological: Negative for weakness, light-headedness, numbness and headaches.  Hematological: Negative for adenopathy. Does not bruise/bleed easily.  Psychiatric/Behavioral: Negative for dysphoric mood. The patient is not nervous/anxious.         Objective:   Physical Exam  Constitutional: She appears well-developed and  well-nourished. No distress.  HENT:  Head: Normocephalic and atraumatic.  Mouth/Throat: Oropharynx is clear and moist.  Eyes: Conjunctivae and EOM are normal. Pupils are equal, round, and reactive to light.  Neck: Normal range of motion. Neck supple.  Cardiovascular: Normal rate and regular rhythm.   Pulmonary/Chest: Effort normal and breath sounds normal.  Abdominal: Soft. Bowel sounds are normal. She exhibits no distension and no mass. There is tenderness in the suprapubic area. There is no rebound, no guarding and no CVA tenderness.  Lymphadenopathy:    She has no cervical adenopathy.  Neurological: She is alert.  Skin: Skin is warm and dry. No rash noted. No erythema.  Psychiatric: She has a normal mood and affect.          Assessment & Plan:   Problem List Items Addressed This Visit      Genitourinary   UTI (urinary tract infection)    With severe dysuria  Cover with cipro Enc water intake  cx urine and update  Update if not starting to improve in several days or if worsening      Relevant Orders      Urine culture    Other Visit Diagnoses    Dysuria    -  Primary    Relevant Orders  POCT urinalysis dipstick (Completed)

## 2014-12-22 ENCOUNTER — Telehealth: Payer: Self-pay | Admitting: Family Medicine

## 2014-12-22 NOTE — Telephone Encounter (Signed)
emmi mailed  °

## 2014-12-24 LAB — URINE CULTURE: Colony Count: >=100000

## 2015-02-11 ENCOUNTER — Other Ambulatory Visit: Payer: Self-pay | Admitting: Family Medicine

## 2015-03-12 ENCOUNTER — Other Ambulatory Visit: Payer: Self-pay | Admitting: Family Medicine

## 2015-03-12 NOTE — Telephone Encounter (Signed)
Called in rx to [facility].

## 2015-03-12 NOTE — Telephone Encounter (Signed)
Px written for call in   

## 2015-03-12 NOTE — Telephone Encounter (Signed)
XANAX refill. Last prescribed on 09/06/14. Last seen for acute on 12/18/14. CPE schedule on 08/13/15.

## 2015-03-17 ENCOUNTER — Other Ambulatory Visit: Payer: Self-pay | Admitting: Family Medicine

## 2015-03-22 ENCOUNTER — Telehealth: Payer: Self-pay

## 2015-03-22 NOTE — Telephone Encounter (Signed)
Left message for pt to call back if she still wants flu vaccine 

## 2015-06-04 ENCOUNTER — Other Ambulatory Visit: Payer: Self-pay | Admitting: Family Medicine

## 2015-06-04 NOTE — Telephone Encounter (Signed)
Please refill times one  

## 2015-06-04 NOTE — Telephone Encounter (Signed)
done

## 2015-06-04 NOTE — Telephone Encounter (Signed)
Electronic refill request, pt has a CPE scheduled on 08/13/15, Rx not on med list, please advise

## 2015-06-09 ENCOUNTER — Other Ambulatory Visit: Payer: Self-pay

## 2015-06-09 MED ORDER — ALPRAZOLAM 0.5 MG PO TABS: ORAL_TABLET | ORAL | Status: DC

## 2015-06-09 NOTE — Telephone Encounter (Signed)
Px written for call in   

## 2015-06-09 NOTE — Telephone Encounter (Signed)
Rx called in as prescribed 

## 2015-06-09 NOTE — Telephone Encounter (Signed)
Pt request refill alprazolam to [facility]. Last seen annual 07/17/14 and med wellness scheduled on 08/13/15. Pt going out of town in 06/10/15 and request refill done today for alprazolam.Please advise. Last refilled # 60 x 2 on 03/12/15.

## 2015-08-13 ENCOUNTER — Encounter: Payer: Self-pay | Admitting: Family Medicine

## 2015-08-17 ENCOUNTER — Other Ambulatory Visit: Payer: Self-pay | Admitting: Family Medicine

## 2015-08-27 ENCOUNTER — Encounter: Payer: Self-pay | Admitting: Family Medicine

## 2015-08-27 ENCOUNTER — Ambulatory Visit (INDEPENDENT_AMBULATORY_CARE_PROVIDER_SITE_OTHER): Payer: Medicare Other | Admitting: Family Medicine

## 2015-08-27 VITALS — BP 122/64 | HR 97 | Temp 98.1°F | Wt 144.0 lb

## 2015-08-27 DIAGNOSIS — R739 Hyperglycemia, unspecified: Secondary | ICD-10-CM | POA: Diagnosis not present

## 2015-08-27 DIAGNOSIS — Z0000 Encounter for general adult medical examination without abnormal findings: Secondary | ICD-10-CM

## 2015-08-27 DIAGNOSIS — I10 Essential (primary) hypertension: Secondary | ICD-10-CM

## 2015-08-27 DIAGNOSIS — Z1211 Encounter for screening for malignant neoplasm of colon: Secondary | ICD-10-CM

## 2015-08-27 DIAGNOSIS — F17209 Nicotine dependence, unspecified, with unspecified nicotine-induced disorders: Secondary | ICD-10-CM

## 2015-08-27 DIAGNOSIS — E785 Hyperlipidemia, unspecified: Secondary | ICD-10-CM

## 2015-08-27 DIAGNOSIS — E2839 Other primary ovarian failure: Secondary | ICD-10-CM | POA: Insufficient documentation

## 2015-08-27 LAB — CBC WITH DIFFERENTIAL/PLATELET
BASOS PCT: 0.4 % (ref 0.0–3.0)
Basophils Absolute: 0 10*3/uL (ref 0.0–0.1)
EOS ABS: 0.1 10*3/uL (ref 0.0–0.7)
Eosinophils Relative: 1 % (ref 0.0–5.0)
HEMATOCRIT: 55.5 % — AB (ref 36.0–46.0)
Hemoglobin: 16.6 g/dL — ABNORMAL HIGH (ref 12.0–15.0)
Lymphocytes Relative: 18.2 % (ref 12.0–46.0)
Lymphs Abs: 1.5 10*3/uL (ref 0.7–4.0)
MCHC: 29.9 g/dL — ABNORMAL LOW (ref 30.0–36.0)
MCV: 80.6 fl (ref 78.0–100.0)
MONO ABS: 0.6 10*3/uL (ref 0.1–1.0)
Monocytes Relative: 7.7 % (ref 3.0–12.0)
NEUTROS ABS: 6.1 10*3/uL (ref 1.4–7.7)
Neutrophils Relative %: 72.7 % (ref 43.0–77.0)
PLATELETS: 235 10*3/uL (ref 150.0–400.0)
RBC: 6.88 Mil/uL — ABNORMAL HIGH (ref 3.87–5.11)
RDW: 22.3 % — AB (ref 11.5–15.5)
WBC: 8.4 10*3/uL (ref 4.0–10.5)

## 2015-08-27 LAB — LIPID PANEL
Cholesterol: 127 mg/dL (ref 0–200)
HDL: 33.9 mg/dL — AB (ref >39.00)
LDL Cholesterol: 65 mg/dL (ref 0–99)
NonHDL: 93.35
TRIGLYCERIDES: 140 mg/dL (ref 0.0–149.0)
Total CHOL/HDL Ratio: 4
VLDL: 28 mg/dL (ref 0.0–40.0)

## 2015-08-27 LAB — COMPREHENSIVE METABOLIC PANEL
ALT: 7 U/L (ref 0–35)
AST: 12 U/L (ref 0–37)
Albumin: 3.5 g/dL (ref 3.5–5.2)
Alkaline Phosphatase: 89 U/L (ref 39–117)
BUN: 10 mg/dL (ref 6–23)
CALCIUM: 8.8 mg/dL (ref 8.4–10.5)
CHLORIDE: 100 meq/L (ref 96–112)
CO2: 39 mEq/L — ABNORMAL HIGH (ref 19–32)
CREATININE: 0.82 mg/dL (ref 0.40–1.20)
GFR: 72.14 mL/min (ref >60.00)
Glucose, Bld: 81 mg/dL (ref 70–99)
POTASSIUM: 5 meq/L (ref 3.5–5.1)
SODIUM: 141 meq/L (ref 135–145)
Total Bilirubin: 0.4 mg/dL (ref 0.2–1.2)
Total Protein: 7 g/dL (ref 6.0–8.3)

## 2015-08-27 LAB — TSH: TSH: 1.07 u[IU]/mL (ref 0.35–4.50)

## 2015-08-27 LAB — HEMOGLOBIN A1C: HEMOGLOBIN A1C: 6.4 % (ref 4.6–6.5)

## 2015-08-27 NOTE — Progress Notes (Signed)
Subjective:    Patient ID: Nancy Herrera, female    DOB: 07-12-1941, 74 y.o.   MRN: [id number]  HPI Here for annual medicare wellness visit as well as chronic/acute medical problems and also annual preventative exam  I have personally reviewed the Medicare Annual Wellness questionnaire and have noted 1. The patient's medical and social history 2. Their use of alcohol, tobacco or illicit drugs 3. Their current medications and supplements 4. The patient's functional ability including ADL's, fall risks, home safety risks and hearing or visual             impairment. 5. Diet and physical activities 6. Evidence for depression or mood disorders  The patients weight, height, BMI have been recorded in the chart and visual acuity is per eye clinic.  I have made referrals, counseling and provided education to the patient based review of the above and I have provided the pt with a written personalized care plan for preventive services. Reviewed and updated provider list, see scanned forms.  See scanned forms.  Routine anticipatory guidance given to patient.  See health maintenance. Colon cancer screening- declines colonoscopies , will do ifob stool kit  Breast cancer screening- 2/15 - due for one / goes to the breast center  Self breast exam - no lumps  Flu vaccine -will wait a month to get it  Tetanus vaccine 12/13 -utd  Pneumovax had both 7/15 was the last  Zoster vaccine 10/13 Has never had a bone density test - is interested in one , no falls or fractures, takes her vit D  Advance directive does not have a living will and POA yet -- is drawn up but needs to be notarized  Cognitive function addressed- see scanned forms- and if abnormal then additional documentation follows. - good memory overall   PMH and SH reviewed  Meds, vitals, and allergies reviewed.   ROS: See HPI.  Otherwise negative.    Strong family hx of breast cancer  Mother and sister and daughter  Daughter is taking  genetic tests -just found it and is at stage 1 and will be treated   Needs labs drawn   bp is stable today  No cp or palpitations or headaches or edema  No side effects to medicines  BP Readings from Last 3 Encounters:  08/27/15 122/64  12/21/14 130/74  07/17/14 130/70     Wt is down 1 lb with bmi 24   Smoking/ nicotine - less than 1/2 ppd - she is not ready to quit yet -knows she wants to   Lab Results  Component Value Date   HGBA1C 6.4* 07/17/2014   Lab Results  Component Value Date   CHOL 149 07/17/2014   HDL 38* 07/17/2014   LDLCALC 81 07/17/2014   LDLDIRECT 152.8 10/12/2009   TRIG 151* 07/17/2014   CHOLHDL 3.9 07/17/2014    Is eating a healthy diet  Getting exercise on her bike   Patient Active Problem List   Diagnosis Date Noted  . Routine general medical examination at a health care facility 08/27/2015  . Estrogen deficiency 08/27/2015  . UTI (urinary tract infection) 12/21/2014  . Encounter for Medicare annual wellness exam 07/17/2014  . Colon cancer screening 07/17/2014  . Pedal edema 03/09/2014  . Other screening mammogram 11/18/2012  . Immunization counseling 08/02/2012  . 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 SHOULDER PAIN, BILATERAL 12/06/2010  . ALLERGIC RHINITIS 08/23/2010  . Hyperglycemia 08/23/2010  . Hyperlipidemia 04/10/2007  . ANXIETY  04/10/2007  . Nicotine dependence 04/10/2007  . DEPRESSION 04/10/2007  . Essential hypertension 04/10/2007  . GERD 04/10/2007  . DEGENERATIVE DISC DISEASE, LUMBOSACRAL SPINE 04/10/2007   Past Medical History  Diagnosis Date  . Hypertension   . Cancer   . Degenerative disc disease, lumbar   . Hyperlipidemia   . GERD (gastroesophageal reflux disease)   . Hyperglycemia     controlled by diet  . Anxiety   . Depression   . Allergy     allergic rhinitis   Past Surgical History  Procedure Laterality Date  . Cataract extraction     Social History  Substance Use Topics  . Smoking status:  Current Every Day Smoker -- 0.50 packs/day    Types: Cigarettes  . Smokeless tobacco: None     Comment: smokes E cigarettes  . Alcohol Use: No   Family History  Problem Relation Age of Onset  . Cancer Mother     breast cancer  . Cancer Sister     breast cancer & lung cancer  . Breast cancer Daughter    No Known Allergies Current Outpatient Prescriptions on File Prior to Visit  Medication Sig Dispense Refill  . acetaminophen (TYLENOL) 500 MG tablet OTC as directed     . ALPRAZolam (XANAX) 0.5 MG tablet TAKE 1 TABLET BY MOUTH EVERY DAY AS NEEDED FOR ANXIETY 60 tablet 3  . aspirin 81 MG tablet Take 81 mg by mouth daily.      Marland Kitchen glucose blood test strip To check glucose once daily and as needed for diabetes type 2     . glucose monitoring kit (FREESTYLE) monitoring kit To check glucose once daily and as needed for diabetes (250.0)     . Lancets MISC To check glucose once daily and as needed for diabetes type 2     . lisinopril (PRINIVIL,ZESTRIL) 10 MG tablet TAKE 1 TABLET BY MOUTH DAILY 90 tablet 0  . PARoxetine (PAXIL) 40 MG tablet TAKE 1 TABLET (40 MG TOTAL) BY MOUTH DAILY. 90 tablet 3  . promethazine (PHENERGAN) 25 MG tablet TAKE 1 TABLET EVERY 8 HOURS AS NEEDED FOR NAUSEA 30 tablet 0  . simvastatin (ZOCOR) 40 MG tablet TAKE 1 TABLET BY MOUTH EVERY DAY 90 tablet 3   No current facility-administered medications on file prior to visit.    Review of Systems Review of Systems  Constitutional: Negative for fever, appetite change, fatigue and unexpected weight change.  Eyes: Negative for pain and visual disturbance.  Respiratory: Negative for cough and shortness of breath.   Cardiovascular: Negative for cp or palpitations    Gastrointestinal: Negative for nausea, diarrhea and constipation.  Genitourinary: Negative for urgency and frequency.  Skin: Negative for pallor or rash   Neurological: Negative for weakness, light-headedness, numbness and headaches.  Hematological: Negative for  adenopathy. Does not bruise/bleed easily.  Psychiatric/Behavioral: Negative for dysphoric mood. The patient is sometimes nervous/anxious.  pos for stressors        Objective:   Physical Exam  Constitutional: She appears well-developed and well-nourished. No distress.  Well appearing   HENT:  Head: Normocephalic and atraumatic.  Right Ear: External ear normal.  Left Ear: External ear normal.  Mouth/Throat: Oropharynx is clear and moist.  Eyes: Conjunctivae and EOM are normal. Pupils are equal, round, and reactive to light. No scleral icterus.  Neck: Normal range of motion. Neck supple. No JVD present. Carotid bruit is not present. No thyromegaly present.  Cardiovascular: Normal rate, regular rhythm, normal heart sounds  and intact distal pulses.  Exam reveals no gallop.   Pulmonary/Chest: Effort normal and breath sounds normal. No respiratory distress. She has no wheezes. She exhibits no tenderness.  Diffusely distant bs   No crackles   Abdominal: Soft. Bowel sounds are normal. She exhibits no distension, no abdominal bruit and no mass. There is no tenderness.  Genitourinary: No breast swelling, tenderness, discharge or bleeding.  Musculoskeletal: Normal range of motion. She exhibits no edema or tenderness.  Lymphadenopathy:    She has no cervical adenopathy.  Neurological: She is alert. She has normal reflexes. No cranial nerve deficit. She exhibits normal muscle tone. Coordination normal.  Skin: Skin is warm and dry. No rash noted. No erythema. No pallor.  Diff lentigo and solar aging   Psychiatric: She has a normal mood and affect.          Assessment & Plan:   Problem List Items Addressed This Visit      Cardiovascular and Mediastinum   Essential hypertension    bp in fair control at this time  BP Readings from Last 1 Encounters:  08/27/15 122/64   No changes needed Disc lifstyle change with low sodium diet and exercise  Labs today      Relevant Orders   CBC with  Differential/Platelet (Completed)   Comprehensive metabolic panel (Completed)   TSH (Completed)   Lipid panel (Completed)     Other   Colon cancer screening    Pt declines colonoscopies  Given ifob kit to do No c/o      Relevant Orders   Fecal occult blood, imunochemical   Encounter for Medicare annual wellness exam - Primary    Reviewed health habits including diet and exercise and skin cancer prevention Reviewed appropriate screening tests for age  Also reviewed health mt list, fam hx and immunization status , as well as social and family history   See HPI Please schedule your mammogram  Stop at check out for referral for a bone density test  Please do stool kit for colon cancer screening  Don't forget to get a flu shot in the fall  Also finish your advance directive ( get it notarized) Labs today        Estrogen deficiency    Ref for dexa       Relevant Orders   DG Bone Density   Hyperglycemia    Lab Results  Component Value Date   HGBA1C 6.4 08/27/2015   This is stable Disc low glycemic diet to prevent DM along with exercise       Relevant Orders   Hemoglobin A1c (Completed)   Hyperlipidemia    Disc goals for lipids and reasons to control them Rev labs with pt Rev low sat fat diet in detail Continue simvastatin       Nicotine dependence    Disc in detail risks of smoking and possible outcomes including copd, vascular/ heart disease, cancer , respiratory and sinus infections and also osteoporosis  Pt voices understanding Pt is not ready to quit       Routine general medical examination at a health care facility    Reviewed health habits including diet and exercise and skin cancer prevention Reviewed appropriate screening tests for age  Also reviewed health mt list, fam hx and immunization status , as well as social and family history   See HPI Please schedule your mammogram  Stop at check out for referral for a bone density test  Please do stool kit  for colon cancer screening  Don't forget to get a flu shot in the fall  Also finish your advance directive ( get it notarized) Disc pt's high risk for breast cancer given family hx (mother/sister and now daughter)- enc her to quit smoking and think about genetic testing - unsure if affordable but she will consider it

## 2015-08-27 NOTE — Patient Instructions (Signed)
Please schedule your mammogram  Stop at check out for referral for a bone density test  Please do stool kit for colon cancer screening  Don't forget to get a flu shot in the fall  Also finish your advance directive ( get it notarized) Labs today

## 2015-08-29 NOTE — Assessment & Plan Note (Signed)
Ref for dexa 

## 2015-08-29 NOTE — Assessment & Plan Note (Signed)
Lab Results  Component Value Date   HGBA1C 6.4 08/27/2015   This is stable Disc low glycemic diet to prevent DM along with exercise

## 2015-08-29 NOTE — Assessment & Plan Note (Signed)
bp in fair control at this time  BP Readings from Last 1 Encounters:  08/27/15 122/64   No changes needed Disc lifstyle change with low sodium diet and exercise  Labs today

## 2015-08-29 NOTE — Assessment & Plan Note (Signed)
Disc in detail risks of smoking and possible outcomes including copd, vascular/ heart disease, cancer , respiratory and sinus infections and also osteoporosis  Pt voices understanding Pt is not ready to quit

## 2015-08-29 NOTE — Assessment & Plan Note (Signed)
Reviewed health habits including diet and exercise and skin cancer prevention Reviewed appropriate screening tests for age  Also reviewed health mt list, fam hx and immunization status , as well as social and family history   See HPI Please schedule your mammogram  Stop at check out for referral for a bone density test  Please do stool kit for colon cancer screening  Don't forget to get a flu shot in the fall  Also finish your advance directive ( get it notarized) Labs today

## 2015-08-29 NOTE — Assessment & Plan Note (Signed)
Pt declines colonoscopies  Given ifob kit to do No c/o

## 2015-08-29 NOTE — Assessment & Plan Note (Signed)
Reviewed health habits including diet and exercise and skin cancer prevention Reviewed appropriate screening tests for age  Also reviewed health mt list, fam hx and immunization status , as well as social and family history   See HPI Please schedule your mammogram  Stop at check out for referral for a bone density test  Please do stool kit for colon cancer screening  Don't forget to get a flu shot in the fall  Also finish your advance directive ( get it notarized) Disc pt's high risk for breast cancer given family hx (mother/sister and now daughter)- enc her to quit smoking and think about genetic testing - unsure if affordable but she will consider it

## 2015-08-29 NOTE — Assessment & Plan Note (Signed)
Disc goals for lipids and reasons to control them Rev labs with pt Rev low sat fat diet in detail Continue simvastatin

## 2015-08-31 ENCOUNTER — Encounter: Payer: Self-pay | Admitting: *Deleted

## 2015-08-31 ENCOUNTER — Other Ambulatory Visit: Payer: Self-pay | Admitting: Family Medicine

## 2015-08-31 NOTE — Telephone Encounter (Signed)
done

## 2015-08-31 NOTE — Telephone Encounter (Signed)
Please refill for a year  

## 2015-08-31 NOTE — Telephone Encounter (Signed)
Electronic refill request, pt has CPE on 08/27/15, last refilled on 09/01/14 #90 with 3 additional refills, please advise

## 2015-09-14 ENCOUNTER — Encounter: Payer: Self-pay | Admitting: Family Medicine

## 2015-09-14 ENCOUNTER — Ambulatory Visit (INDEPENDENT_AMBULATORY_CARE_PROVIDER_SITE_OTHER)
Admission: RE | Admit: 2015-09-14 | Discharge: 2015-09-14 | Disposition: A | Discharge status: Home / Self Care | Payer: Medicare Other | Source: Ambulatory Visit | Attending: Family Medicine | Admitting: Family Medicine

## 2015-09-14 ENCOUNTER — Ambulatory Visit (INDEPENDENT_AMBULATORY_CARE_PROVIDER_SITE_OTHER): Payer: Medicare Other | Admitting: Family Medicine

## 2015-09-14 VITALS — BP 130/68 | HR 101 | Temp 98.3°F | Ht 64.25 in | Wt 147.5 lb

## 2015-09-14 DIAGNOSIS — F17200 Nicotine dependence, unspecified, uncomplicated: Secondary | ICD-10-CM

## 2015-09-14 DIAGNOSIS — R600 Localized edema: Secondary | ICD-10-CM

## 2015-09-14 DIAGNOSIS — R0602 Shortness of breath: Secondary | ICD-10-CM | POA: Diagnosis not present

## 2015-09-14 DIAGNOSIS — Z720 Tobacco use: Secondary | ICD-10-CM

## 2015-09-14 MED ORDER — FUROSEMIDE 20 MG PO TABS: 20.0000 mg | ORAL_TABLET | Freq: Every day | ORAL | Status: DC

## 2015-09-14 MED ORDER — ALBUTEROL SULFATE HFA 108 (90 BASE) MCG/ACT IN AERS: 2.0000 | INHALATION_SPRAY | RESPIRATORY_TRACT | Status: DC | PRN

## 2015-09-14 NOTE — Progress Notes (Signed)
Subjective:    Patient ID: Nancy Herrera, female    DOB: 1941-06-07, 74 y.o.   MRN: [id number]  HPI Here for pedal edema   Started swelling last week- was just doing housework  Nothing out of the ordinary  Does not wearing support stockings Not hurting or burning - but shoes are tight   Not improved in the am   Not short of breath right now  She is short of breath on exertion - after about 30 minutes  Unsure if she could walk a block   Is coughing  Phlegm is clear  Feels like it is stuck in her throat  1/2 ppd of cigarettes (cut back 2 mo ago) Tried e cigarettes (vapor) -went well but afraid of them (stories of them blowing up)   Smoked since age 47 - used to smoke a ppd   Does try to avoid salt  Does not drink enough water (tends to drink more coke)   Patient Active Problem List   Diagnosis Date Noted  . SOB (shortness of breath) on exertion 09/14/2015  . Routine general medical examination at a health care facility 08/27/2015  . Estrogen deficiency 08/27/2015  . UTI (urinary tract infection) 12/21/2014  . Encounter for Medicare annual wellness exam 07/17/2014  . Colon cancer screening 07/17/2014  . Pedal edema 03/09/2014  . Other screening mammogram 11/18/2012  . Immunization counseling 08/02/2012  . 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 SHOULDER PAIN, BILATERAL 12/06/2010  . ALLERGIC RHINITIS 08/23/2010  . Hyperglycemia 08/23/2010  . Hyperlipidemia 04/10/2007  . ANXIETY 04/10/2007  . Smoker 04/10/2007  . DEPRESSION 04/10/2007  . Essential hypertension 04/10/2007  . GERD 04/10/2007  . DEGENERATIVE DISC DISEASE, LUMBOSACRAL SPINE 04/10/2007   Past Medical History  Diagnosis Date  . Hypertension   . Cancer   . Degenerative disc disease, lumbar   . Hyperlipidemia   . GERD (gastroesophageal reflux disease)   . Hyperglycemia     controlled by diet  . Anxiety   . Depression   . Allergy     allergic rhinitis   Past Surgical History  Procedure  Laterality Date  . Cataract extraction     Social History  Substance Use Topics  . Smoking status: Current Every Day Smoker -- 0.50 packs/day    Types: Cigarettes  . Smokeless tobacco: None     Comment: smokes E cigarettes  . Alcohol Use: No   Family History  Problem Relation Age of Onset  . Cancer Mother     breast cancer  . Cancer Sister     breast cancer & lung cancer  . Breast cancer Daughter    No Known Allergies Current Outpatient Prescriptions on File Prior to Visit  Medication Sig Dispense Refill  . acetaminophen (TYLENOL) 500 MG tablet OTC as directed     . ALPRAZolam (XANAX) 0.5 MG tablet TAKE 1 TABLET BY MOUTH EVERY DAY AS NEEDED FOR ANXIETY 60 tablet 3  . aspirin 81 MG tablet Take 81 mg by mouth daily.      Marland Kitchen glucose blood test strip To check glucose once daily and as needed for diabetes type 2     . glucose monitoring kit (FREESTYLE) monitoring kit To check glucose once daily and as needed for diabetes (250.0)     . Lancets MISC To check glucose once daily and as needed for diabetes type 2     . lisinopril (PRINIVIL,ZESTRIL) 10 MG tablet TAKE 1 TABLET BY MOUTH DAILY 90 tablet  0  . PARoxetine (PAXIL) 40 MG tablet TAKE 1 TABLET (40 MG TOTAL) BY MOUTH DAILY. 90 tablet 3  . promethazine (PHENERGAN) 25 MG tablet TAKE 1 TABLET EVERY 8 HOURS AS NEEDED FOR NAUSEA 30 tablet 0  . simvastatin (ZOCOR) 40 MG tablet TAKE 1 TABLET BY MOUTH EVERY DAY 90 tablet 3   No current facility-administered medications on file prior to visit.    Review of Systems Review of Systems  Constitutional: Negative for fever, appetite change,  and unexpected weight change.  Eyes: Negative for pain and visual disturbance.  ENT pos for some throat clearing  Respiratory: pos  for cough and shortness of breath.   Cardiovascular: Negative for cp or palpitations   pos for pedal edema, neg for PND or orthopnea  Gastrointestinal: Negative for nausea, diarrhea and constipation.  Genitourinary: Negative  for urgency and frequency.  Skin: Negative for pallor or rash   Neurological: Negative for weakness, light-headedness, numbness and headaches.  Hematological: Negative for adenopathy. Does not bruise/bleed easily.  Psychiatric/Behavioral: Negative for dysphoric mood. The patient is anxious, pos for stressors .         Objective:   Physical Exam  Constitutional: She appears well-developed and well-nourished. No distress.  Well appearing  Not sob at rest   HENT:  Head: Normocephalic and atraumatic.  Mouth/Throat: Oropharynx is clear and moist. No oropharyngeal exudate.  Eyes: Conjunctivae and EOM are normal. Pupils are equal, round, and reactive to light. Right eye exhibits no discharge. Left eye exhibits no discharge. No scleral icterus.  Neck: Normal range of motion. Neck supple. No JVD present. Carotid bruit is not present. No thyromegaly present.  Cardiovascular: Normal rate, regular rhythm, normal heart sounds and intact distal pulses.  Exam reveals no gallop.   Pulmonary/Chest: Effort normal and breath sounds normal. No respiratory distress. She has no wheezes. She has no rales. She exhibits no tenderness.  Diffusely distant bs  Mildly prolonged exp time Crackles in lower bases heard bilaterally   Wheeze on forced exp only  Abdominal: Soft. Bowel sounds are normal. She exhibits no distension, no abdominal bruit and no mass. There is no tenderness.  Musculoskeletal: She exhibits edema.  One plus pedal edema to the ankle bilat  No tenderness or palp cords   Lymphadenopathy:    She has no cervical adenopathy.  Neurological: She is alert. She has normal reflexes. No cranial nerve deficit. She exhibits normal muscle tone. Coordination normal.  Skin: Skin is warm and dry. No rash noted. No pallor.  Ruddy complexion  Psychiatric: She has a normal mood and affect.  Mildly anxious Pleasant           Assessment & Plan:   Problem List Items Addressed This Visit      Other    Pedal edema    She is having more sob on exertion as well (smoker) Check BNP/cmet and cxr (heard crackles on exam)  Start furosemide 20 mg daily  F/u 1 wk   May need echo/cardiac w/u       Relevant Orders   DG Chest 2 View   Comprehensive metabolic panel   Brain natriuretic peptide   Smoker    Semi motivated to quit Now with more sob -suspect copd worsening  Disc in detail risks of smoking and possible outcomes including copd, vascular/ heart disease, cancer , respiratory and sinus infections  Pt voices understanding  Handouts given on smoking cessation-she will consider       SOB (shortness of  breath) on exertion - Primary    Poss multifactorial in a long term smoker (suspect worsening copd)- EKG shows evid of pulm dz (poor R wave prog and Rightward P/QRS axis ) Px for albuterol inhaler/inst re: use and also given a handout  Also has pedal edema -will check cmet and BNP, cxr  (heard crackles on exam)  Given furosemide to start 20 mg daily   F/u 1 wk        Relevant Orders   EKG 12-Lead (Completed)   DG Chest 2 View   Brain natriuretic peptide

## 2015-09-14 NOTE — Assessment & Plan Note (Signed)
Poss multifactorial in a long term smoker (suspect worsening copd)- EKG shows evid of pulm dz (poor R wave prog and Rightward P/QRS axis ) Px for albuterol inhaler/inst re: use and also given a handout  Also has pedal edema -will check cmet and BNP, cxr  (heard crackles on exam)  Given furosemide to start 20 mg daily   F/u 1 wk

## 2015-09-14 NOTE — Progress Notes (Signed)
Pre visit review using our clinic review tool, if applicable. No additional management support is needed unless otherwise documented below in the visit note. 

## 2015-09-14 NOTE — Assessment & Plan Note (Signed)
Semi motivated to quit Now with more sob -suspect copd worsening  Disc in detail risks of smoking and possible outcomes including copd, vascular/ heart disease, cancer , respiratory and sinus infections  Pt voices understanding  Handouts given on smoking cessation-she will consider

## 2015-09-14 NOTE — Patient Instructions (Addendum)
Your EKG shows some signs of lung disease (from smoking)  Lab today for chemistries and a test for heart failure (which can cause swelling)  Take lasix 20 mg once daily (first dose tonight -then each am)  When you sit - elevate feet (to the level of your heart if you can)  Try the albuterol inhaler - 2 puffs up to every 4 hours when needed (here is an instruction sheet on how to use it)  Avoid sodium Drink more water  Follow up with me in about a week   If you get very short of breath of develop chest pain - please go to the ER

## 2015-09-14 NOTE — Assessment & Plan Note (Signed)
She is having more sob on exertion as well (smoker) Check BNP/cmet and cxr (heard crackles on exam)  Start furosemide 20 mg daily  F/u 1 wk   May need echo/cardiac w/u

## 2015-09-15 LAB — COMPREHENSIVE METABOLIC PANEL
ALBUMIN: 3.6 g/dL (ref 3.5–5.2)
ALT: 8 U/L (ref 0–35)
AST: 14 U/L (ref 0–37)
Alkaline Phosphatase: 93 U/L (ref 39–117)
BUN: 8 mg/dL (ref 6–23)
CALCIUM: 9.3 mg/dL (ref 8.4–10.5)
CHLORIDE: 95 meq/L — AB (ref 96–112)
CO2: 42 mEq/L — ABNORMAL HIGH (ref 19–32)
Creatinine, Ser: 0.85 mg/dL (ref 0.40–1.20)
GFR: 69.2 mL/min (ref >60.00)
Glucose, Bld: 94 mg/dL (ref 70–99)
Potassium: 4.5 mEq/L (ref 3.5–5.1)
Sodium: 138 mEq/L (ref 135–145)
Total Bilirubin: 0.4 mg/dL (ref 0.2–1.2)
Total Protein: 7.2 g/dL (ref 6.0–8.3)

## 2015-09-15 LAB — BRAIN NATRIURETIC PEPTIDE: PRO B NATRI PEPTIDE: 310 pg/mL — AB (ref 0.0–100.0)

## 2015-09-24 ENCOUNTER — Encounter: Payer: Self-pay | Admitting: Family Medicine

## 2015-09-24 ENCOUNTER — Ambulatory Visit (INDEPENDENT_AMBULATORY_CARE_PROVIDER_SITE_OTHER): Payer: Medicare Other | Admitting: Family Medicine

## 2015-09-24 VITALS — BP 130/66 | HR 88 | Temp 98.0°F | Ht 64.25 in | Wt 147.2 lb

## 2015-09-24 DIAGNOSIS — R0602 Shortness of breath: Secondary | ICD-10-CM | POA: Diagnosis not present

## 2015-09-24 DIAGNOSIS — R600 Localized edema: Secondary | ICD-10-CM

## 2015-09-24 DIAGNOSIS — Z720 Tobacco use: Secondary | ICD-10-CM | POA: Diagnosis not present

## 2015-09-24 DIAGNOSIS — J441 Chronic obstructive pulmonary disease with (acute) exacerbation: Secondary | ICD-10-CM | POA: Insufficient documentation

## 2015-09-24 DIAGNOSIS — F17200 Nicotine dependence, unspecified, uncomplicated: Secondary | ICD-10-CM

## 2015-09-24 LAB — BASIC METABOLIC PANEL
BUN: 11 mg/dL (ref 6–23)
CO2: 39 mEq/L — ABNORMAL HIGH (ref 19–32)
CREATININE: 0.83 mg/dL (ref 0.40–1.20)
Calcium: 9.1 mg/dL (ref 8.4–10.5)
Chloride: 93 mEq/L — ABNORMAL LOW (ref 96–112)
GFR: 71.12 mL/min (ref >60.00)
Glucose, Bld: 82 mg/dL (ref 70–99)
POTASSIUM: 4.3 meq/L (ref 3.5–5.1)
Sodium: 136 mEq/L (ref 135–145)

## 2015-09-24 MED ORDER — TIOTROPIUM BROMIDE MONOHYDRATE 18 MCG IN CAPS: 18.0000 ug | ORAL_CAPSULE | Freq: Every day | RESPIRATORY_TRACT | Status: DC

## 2015-09-24 NOTE — Progress Notes (Signed)
Pre visit review using our clinic review tool, if applicable. No additional management support is needed unless otherwise documented below in the visit note. 

## 2015-09-24 NOTE — Progress Notes (Signed)
Subjective:    Patient ID: Nancy Herrera, female    DOB: Mar 06, 1941, 74 y.o.   MRN: [id number]  HPI Seen last time for swelling of feet and sob   Feet are still swollen - lasix helped but it did not go away all together  Is interested in supp hose   Breathing is the same  Tried the albuterol - helps at rest but still sob walking   Office Visit on 09/14/2015  Component Date Value Ref Range Status  . Sodium 09/14/2015 138  135 - 145 mEq/L Final  . Potassium 09/14/2015 4.5  3.5 - 5.1 mEq/L Final  . Chloride 09/14/2015 95* 96 - 112 mEq/L Final  . CO2 09/14/2015 42* 19 - 32 mEq/L Final  . Glucose, Bld 09/14/2015 94  70 - 99 mg/dL Final  . BUN 09/14/2015 8  6 - 23 mg/dL Final  . Creatinine, Ser 09/14/2015 0.85  0.40 - 1.20 mg/dL Final  . Total Bilirubin 09/14/2015 0.4  0.2 - 1.2 mg/dL Final  . Alkaline Phosphatase 09/14/2015 93  39 - 117 U/L Final  . AST 09/14/2015 14  0 - 37 U/L Final  . ALT 09/14/2015 8  0 - 35 U/L Final  . Total Protein 09/14/2015 7.2  6.0 - 8.3 g/dL Final  . Albumin 09/14/2015 3.6  3.5 - 5.2 g/dL Final  . Calcium 09/14/2015 9.3  8.4 - 10.5 mg/dL Final  . GFR 09/14/2015 69.20  >60.00 mL/min Final  . Pro B Natriuretic peptide (BNP) 09/14/2015 310.0* 0.0 - 100.0 pg/mL Final    Dg Chest 2 View  09/15/2015   CLINICAL DATA:  Shortness of breath on exertion. Chronic smoker. Crackles on exam.  EXAM: CHEST  2 VIEW  COMPARISON:  03/09/2014  FINDINGS: There is hyperinflation of the lungs compatible with COPD. Heart is mildly enlarged. No confluent airspace opacities or effusions. No acute bony abnormality.  IMPRESSION: COPD.  Cardiomegaly.  No active disease.   Electronically Signed   By: Rolm Baptise M.D.   On: 09/15/2015 09:12     Plans to become determined to quit smoking  Tomorrow is her quit date and will start the patches 1/2 ppd right now   Patient Active Problem List   Diagnosis Date Noted  . SOB (shortness of breath) on exertion 09/14/2015  . Routine general  medical examination at a health care facility 08/27/2015  . Estrogen deficiency 08/27/2015  . UTI (urinary tract infection) 12/21/2014  . Encounter for Medicare annual wellness exam 07/17/2014  . Colon cancer screening 07/17/2014  . Pedal edema 03/09/2014  . Other screening mammogram 11/18/2012  . Immunization counseling 08/02/2012  . 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 SHOULDER PAIN, BILATERAL 12/06/2010  . ALLERGIC RHINITIS 08/23/2010  . Hyperglycemia 08/23/2010  . Hyperlipidemia 04/10/2007  . ANXIETY 04/10/2007  . Smoker 04/10/2007  . DEPRESSION 04/10/2007  . Essential hypertension 04/10/2007  . GERD 04/10/2007  . DEGENERATIVE DISC DISEASE, LUMBOSACRAL SPINE 04/10/2007   Past Medical History  Diagnosis Date  . Hypertension   . Cancer   . Degenerative disc disease, lumbar   . Hyperlipidemia   . GERD (gastroesophageal reflux disease)   . Hyperglycemia     controlled by diet  . Anxiety   . Depression   . Allergy     allergic rhinitis   Past Surgical History  Procedure Laterality Date  . Cataract extraction     Social History  Substance Use Topics  . Smoking status: Current Every Day Smoker --  0.50 packs/day    Types: Cigarettes  . Smokeless tobacco: None     Comment: smokes E cigarettes  . Alcohol Use: No   Family History  Problem Relation Age of Onset  . Cancer Mother     breast cancer  . Cancer Sister     breast cancer & lung cancer  . Breast cancer Daughter    No Known Allergies Current Outpatient Prescriptions on File Prior to Visit  Medication Sig Dispense Refill  . acetaminophen (TYLENOL) 500 MG tablet OTC as directed     . albuterol (PROVENTIL HFA;VENTOLIN HFA) 108 (90 BASE) MCG/ACT inhaler Inhale 2 puffs into the lungs every 4 (four) hours as needed for wheezing or shortness of breath. 1 Inhaler 11  . ALPRAZolam (XANAX) 0.5 MG tablet TAKE 1 TABLET BY MOUTH EVERY DAY AS NEEDED FOR ANXIETY 60 tablet 3  . aspirin 81 MG tablet Take 81 mg by  mouth daily.      . furosemide (LASIX) 20 MG tablet Take 1 tablet (20 mg total) by mouth daily. 30 tablet 0  . glucose blood test strip To check glucose once daily and as needed for diabetes type 2     . glucose monitoring kit (FREESTYLE) monitoring kit To check glucose once daily and as needed for diabetes (250.0)     . Lancets MISC To check glucose once daily and as needed for diabetes type 2     . lisinopril (PRINIVIL,ZESTRIL) 10 MG tablet TAKE 1 TABLET BY MOUTH DAILY 90 tablet 0  . PARoxetine (PAXIL) 40 MG tablet TAKE 1 TABLET (40 MG TOTAL) BY MOUTH DAILY. 90 tablet 3  . promethazine (PHENERGAN) 25 MG tablet TAKE 1 TABLET EVERY 8 HOURS AS NEEDED FOR NAUSEA 30 tablet 0  . simvastatin (ZOCOR) 40 MG tablet TAKE 1 TABLET BY MOUTH EVERY DAY 90 tablet 3   No current facility-administered medications on file prior to visit.      Review of Systems Review of Systems  Constitutional: Negative for fever, appetite change,  and unexpected weight change. pos for fatigue  Eyes: Negative for pain and visual disturbance.  Respiratory: Negative for cough and pos for shortness of breath on exertion   Cardiovascular: Negative for cp or palpitations   neg for PND or orthopnea  Gastrointestinal: Negative for nausea, diarrhea and constipation.  Genitourinary: Negative for urgency and frequency.  Skin: Negative for pallor or rash   Neurological: Negative for weakness, light-headedness, numbness and headaches.  Hematological: Negative for adenopathy. Does not bruise/bleed easily.  Psychiatric/Behavioral: Negative for dysphoric mood. The patient is nervous/anxious.(with stressors)         Objective:   Physical Exam  Constitutional: She appears well-developed and well-nourished. No distress.  Frail appearing elderly female  HENT:  Head: Normocephalic and atraumatic.  Mouth/Throat: Oropharynx is clear and moist.  Eyes: Conjunctivae and EOM are normal. Pupils are equal, round, and reactive to light.    Neck: Normal range of motion. Neck supple. No JVD present. Carotid bruit is not present. No thyromegaly present.  Cardiovascular: Normal rate, regular rhythm, normal heart sounds and intact distal pulses.  Exam reveals no gallop.   Pulmonary/Chest: Effort normal and breath sounds normal. No respiratory distress. She has no wheezes. She has no rales.  No crackles today   Diffusely distant bs  Harsh at bases   Abdominal: Soft. Bowel sounds are normal. She exhibits no distension, no abdominal bruit and no mass. There is no tenderness.  Musculoskeletal: She exhibits edema.  Trace  edema in ankles -improved from prev exam  Lymphadenopathy:    She has no cervical adenopathy.  Neurological: She is alert. She has normal reflexes.  Skin: Skin is warm and dry. No rash noted. No pallor.  No cyanosis  Psychiatric: Her speech is normal and behavior is normal. Thought content normal. Her mood appears anxious. She does not exhibit a depressed mood.          Assessment & Plan:   Problem List Items Addressed This Visit      Respiratory   COPD exacerbation ([place])    Quit date for smoking is tomorrow after cutting down  Px for spiriva to start as directed Albuterol prn  Update if fever or cough Ref to pulmonary       Relevant Medications   tiotropium (SPIRIVA) 18 MCG inhalation capsule   Other Relevant Orders   Ambulatory referral to Pulmonology     Other   Pedal edema    Improved with lasix  Her BNP is slt elevated  No change in sob  Lab today- bmp   Consider 2D echo after her pulmonary w/u  Suggested supp stockings to the knee also with elevation when able       Relevant Orders   Basic metabolic panel (Completed)   Smoker    Disc in detail risks of smoking and possible outcomes including copd, vascular/ heart disease, cancer , respiratory and sinus infections  Pt voices understanding  She is developing worsening symptoms of COPD Ref to pulmonary  Her smoking quit date is  tomorrow       Relevant Orders   Ambulatory referral to Pulmonology   SOB (shortness of breath) on exertion - Primary    Rev cxr and exam  Suspect main cause is copd -see assessment  May end up needing 02 for exertion  Disc poss of cardiac cause as well - plan on echocardiogram after her pulm visit/work up       Relevant Orders   Ambulatory referral to Pulmonology

## 2015-09-24 NOTE — Patient Instructions (Addendum)
Quit smoking tomorrow as planned Your oxygen is borderline low at rest - when active it goes down further  Continue albuterol Start spiriva -as directed as well for breathing  Stop at check out for referral to a pulmonary doctor  If symptoms worsen let me know Lab today for potassium  For left over swelling you can try some knee high stockings   Later- I want to refer you for an ultrasound of the heart (echocardiogram) to see how heart is working

## 2015-09-26 NOTE — Assessment & Plan Note (Signed)
Rev cxr and exam  Suspect main cause is copd -see assessment  May end up needing 02 for exertion  Disc poss of cardiac cause as well - plan on echocardiogram after her pulm visit/work up

## 2015-09-26 NOTE — Assessment & Plan Note (Signed)
Quit date for smoking is tomorrow after cutting down  Px for spiriva to start as directed Albuterol prn  Update if fever or cough Ref to pulmonary

## 2015-09-26 NOTE — Assessment & Plan Note (Signed)
Improved with lasix  Her BNP is slt elevated  No change in sob  Lab today- bmp   Consider 2D echo after her pulmonary w/u  Suggested supp stockings to the knee also with elevation when able

## 2015-09-26 NOTE — Assessment & Plan Note (Signed)
Disc in detail risks of smoking and possible outcomes including copd, vascular/ heart disease, cancer , respiratory and sinus infections  Pt voices understanding  She is developing worsening symptoms of COPD Ref to pulmonary  Her smoking quit date is tomorrow

## 2015-09-28 ENCOUNTER — Other Ambulatory Visit: Payer: Self-pay

## 2015-09-28 MED ORDER — PROMETHAZINE HCL 25 MG PO TABS: ORAL_TABLET | ORAL | Status: DC

## 2015-09-28 NOTE — Telephone Encounter (Signed)
Please refill times one  

## 2015-09-28 NOTE — Telephone Encounter (Signed)
Pt left v/m requesting refill phenergan(last refilled # 30 on 06/04/15) to [facility] Pt has nausea on and off with nerves. Pt last seen 09/24/15.Please advise.

## 2015-09-28 NOTE — Telephone Encounter (Signed)
Done, and pt notified

## 2015-09-29 ENCOUNTER — Other Ambulatory Visit: Payer: Self-pay | Admitting: Family Medicine

## 2015-09-29 DIAGNOSIS — Z1231 Encounter for screening mammogram for malignant neoplasm of breast: Secondary | ICD-10-CM

## 2015-10-10 ENCOUNTER — Other Ambulatory Visit: Payer: Self-pay | Admitting: Family Medicine

## 2015-10-12 ENCOUNTER — Ambulatory Visit: Payer: Medicare Other | Admitting: Family Medicine

## 2015-10-13 ENCOUNTER — Telehealth: Payer: Self-pay

## 2015-10-13 NOTE — Telephone Encounter (Signed)
Patient left a voicemail stating that she recived a refill on her fluid pills. Patient states that she is feeling better, and is wondering if she should continue these medications?

## 2015-10-13 NOTE — Telephone Encounter (Signed)
Yes- I'm glad she is doing better and I want her to continue them. See the lung doctor on 10/21 as planned and then I will likely order her echocardiogram after that evaluation.

## 2015-10-14 NOTE — Telephone Encounter (Signed)
Pt notified of Dr. Tower's instructions and verbalized understanding  

## 2015-10-15 ENCOUNTER — Ambulatory Visit (INDEPENDENT_AMBULATORY_CARE_PROVIDER_SITE_OTHER): Payer: Medicare Other | Admitting: Pulmonary Disease

## 2015-10-15 ENCOUNTER — Encounter: Payer: Self-pay | Admitting: Pulmonary Disease

## 2015-10-15 VITALS — BP 112/60 | HR 71 | Ht 66.0 in | Wt 149.0 lb

## 2015-10-15 DIAGNOSIS — Z720 Tobacco use: Secondary | ICD-10-CM | POA: Diagnosis not present

## 2015-10-15 DIAGNOSIS — J439 Emphysema, unspecified: Secondary | ICD-10-CM | POA: Diagnosis not present

## 2015-10-15 DIAGNOSIS — F17200 Nicotine dependence, unspecified, uncomplicated: Secondary | ICD-10-CM

## 2015-10-15 DIAGNOSIS — R0600 Dyspnea, unspecified: Secondary | ICD-10-CM

## 2015-10-15 MED ORDER — UMECLIDINIUM-VILANTEROL 62.5-25 MCG/INH IN AEPB: 1.0000 | INHALATION_SPRAY | Freq: Every day | RESPIRATORY_TRACT | Status: DC

## 2015-10-15 MED ORDER — UMECLIDINIUM-VILANTEROL 62.5-25 MCG/INH IN AEPB: 1.0000 | INHALATION_SPRAY | Freq: Every day | RESPIRATORY_TRACT | Status: AC

## 2015-10-15 NOTE — Patient Instructions (Signed)
We talked about strategies to quit smoking We talked about strategies of living with COPD/emphysema Stop Spiriva (the capsule inhaler) I have provided a sample and written a prescription for Anoro Continue to use albuterol rescue inhaler (the spray inhaler) as needed When you return, we will measure lung function Follow up in 2-4 weeks

## 2015-10-15 NOTE — Progress Notes (Signed)
PULMONARY CONSULT NOTE  Requesting MD/Service: Tower Date of Consultation:  10/15/15   HPI:  74 yo F smoker of 1/2 PPD currently and previously up to 1 ppd referred for evaluation of moderate to severe exertional dyspnea for several years. She is able to walk slowly for approx 50 yards before having to stop to catch her breath. She can struggle up one flight of stairs. She has chronic occasional cough productive of scant clear mucus. She has never been hospitalized for respiratory or pulmonary problems. She has not had exacerbations of COPD. She is unable to say whether Spiriva has helped but she does believe that she gets some relief from albuterol which she uses 2-4 times per day. With regard to her smoking, she has tried to stop a few times over the last few years. The longest she has gone is 2-3 days. She has purchased nicotine patches but has not used them  Past Medical History  Diagnosis Date  . Hypertension   . Cancer ([place])   . Degenerative disc disease, lumbar   . Hyperlipidemia   . GERD (gastroesophageal reflux disease)   . Hyperglycemia     controlled by diet  . Anxiety   . Depression   . Allergy     allergic rhinitis   Past Surgical History  Procedure Laterality Date  . Cataract extraction     MEDICATIONS: reviewed. Include Spiriva and PRN albuterol  Social History   Social History  . Marital Status: Married    Spouse Name: N/A  . Number of Children: N/A  . Years of Education: N/A   Occupational History  . Not on file.   Social History Main Topics  . Smoking status: Current Every Day Smoker -- 0.50 packs/day    Types: Cigarettes  . Smokeless tobacco: Not on file     Comment: smokes E cigarettes  . Alcohol Use: No  . Drug Use: No  . Sexual Activity: Not on file   Other Topics Concern  . Not on file   Social History Narrative    Family History  Problem Relation Age of Onset  . Cancer Mother     breast cancer  . Cancer Sister     breast cancer &  lung cancer  . Breast cancer Daughter     Review of Systems  Constitutional: Negative.   HENT: Negative.   Eyes: Negative.   Respiratory: Positive for cough, sputum production and shortness of breath. Negative for hemoptysis.   Cardiovascular: Positive for leg swelling. Negative for chest pain, palpitations, orthopnea and PND.       LE edema improved with diuretic medication  Gastrointestinal: Negative.   Genitourinary: Negative.   Musculoskeletal: Negative.   Skin: Negative.   Neurological: Negative.   Endo/Heme/Allergies: Negative.      Filed Vitals:   10/15/15 0947  BP: 112/60  Pulse: 71  Height: '5\' 6"'$  (3.151 m)  Weight: 67.586 kg (149 lb)  SpO2: 93%    EXAM:  Gen: WDWN, NAD at rest HEENT: TMs patent, TCs normal, nasal mucosa normal, OP benign Neck: No LAN or TM Lungs: hyperresonant to percussion, markedly diminished BS. No wheezes or other adventitious sounds Cardiovascular: regular, no M Abdomen: Soft, NT, +BS Ext: no C/C/E Neuro: no focal deficits Skin:   DATA:  CXR (09/14/15): hyperinflated, hyperlucent, flattened diaphragms c/w emphysema  IMPRESSION:   Emphysema - likely moderate to severe based on CXR and severity of symptoms (class II to class III dyspnea). It is not clear that  she has benefited from anticholinergic bronchodilator therapy. However, she seems to respond to SABA. She does have chronic cough and minimal sputum production suggesting chronic bronchitis but she does not seem to have significant airway hypereactivity Smoker - she has been recalcitrant but has not really made a concerted effort @ cessation   PLAN:  1) We spent 15 mins discussing the need for smoking cessation including its benefits and strategies. 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 Begin Anoro (essentially adding a LABA to an long acting anticholinergic) 3)  continue PRN albuterol 4) PFTs ordered 5) ROV in 2-4 wks to assess response to medication changes above, review PFT results and discuss further smoking cessation  Merton Border, MD PCCM service Mobile [phone] Pager [phone]

## 2015-10-18 ENCOUNTER — Telehealth: Payer: Self-pay | Admitting: *Deleted

## 2015-10-18 NOTE — Telephone Encounter (Signed)
Pt notified of Dr. Marliss Coots comments/instructions and verbalized understanding, pt will check with insurance to see if it's covered and let us know

## 2015-10-18 NOTE — Telephone Encounter (Signed)
Since she is not fully diabetic (but borderline) -I doubt her ins will cover it  Ask her to call her ins and tell them she is "prediabetic" - if they will cover I need the exact brand name of machine/strips and lancets she will need that are covered   If she wants to pay out of pocket -that will not matter

## 2015-10-18 NOTE — Telephone Encounter (Signed)
Patient called requesting a prescription for glucose meter and lancets to [facility].  She is unsure of what brand (she states she has never had one before).  She currently isn't checking her blood sugar.  Okay to send prescriptions?

## 2015-10-26 ENCOUNTER — Ambulatory Visit (INDEPENDENT_AMBULATORY_CARE_PROVIDER_SITE_OTHER): Payer: Medicare Other

## 2015-10-26 DIAGNOSIS — Z23 Encounter for immunization: Secondary | ICD-10-CM | POA: Diagnosis not present

## 2015-11-02 ENCOUNTER — Ambulatory Visit: Payer: Medicare Other | Admitting: Pulmonary Disease

## 2015-11-03 ENCOUNTER — Ambulatory Visit (INDEPENDENT_AMBULATORY_CARE_PROVIDER_SITE_OTHER): Payer: Medicare Other | Admitting: Family Medicine

## 2015-11-03 ENCOUNTER — Encounter: Payer: Self-pay | Admitting: Family Medicine

## 2015-11-03 VITALS — BP 150/82 | HR 114 | Temp 98.3°F | Ht 66.0 in | Wt 149.2 lb

## 2015-11-03 DIAGNOSIS — W19XXXA Unspecified fall, initial encounter: Secondary | ICD-10-CM

## 2015-11-03 DIAGNOSIS — R0602 Shortness of breath: Secondary | ICD-10-CM | POA: Diagnosis not present

## 2015-11-03 DIAGNOSIS — R300 Dysuria: Secondary | ICD-10-CM

## 2015-11-03 DIAGNOSIS — N3000 Acute cystitis without hematuria: Secondary | ICD-10-CM | POA: Diagnosis not present

## 2015-11-03 DIAGNOSIS — S0121XA Laceration without foreign body of nose, initial encounter: Secondary | ICD-10-CM | POA: Insufficient documentation

## 2015-11-03 DIAGNOSIS — F411 Generalized anxiety disorder: Secondary | ICD-10-CM

## 2015-11-03 DIAGNOSIS — J441 Chronic obstructive pulmonary disease with (acute) exacerbation: Secondary | ICD-10-CM

## 2015-11-03 DIAGNOSIS — Y92009 Unspecified place in unspecified non-institutional (private) residence as the place of occurrence of the external cause: Secondary | ICD-10-CM | POA: Insufficient documentation

## 2015-11-03 DIAGNOSIS — Y92099 Unspecified place in other non-institutional residence as the place of occurrence of the external cause: Secondary | ICD-10-CM

## 2015-11-03 LAB — POCT URINALYSIS DIPSTICK
BILIRUBIN UA: NEGATIVE
GLUCOSE UA: NEGATIVE
KETONES UA: NEGATIVE
Nitrite, UA: NEGATIVE
Protein, UA: NEGATIVE
SPEC GRAV UA: 1.01
UROBILINOGEN UA: 0.2
pH, UA: 6.5

## 2015-11-03 MED ORDER — MUPIROCIN 2 % EX OINT: 1.0000 "application " | TOPICAL_OINTMENT | Freq: Two times a day (BID) | CUTANEOUS | Status: DC

## 2015-11-03 MED ORDER — ALPRAZOLAM 0.5 MG PO TABS: ORAL_TABLET | ORAL | Status: DC

## 2015-11-03 MED ORDER — PAROXETINE HCL 40 MG PO TABS: ORAL_TABLET | ORAL | Status: DC

## 2015-11-03 MED ORDER — CIPROFLOXACIN HCL 250 MG PO TABS: 250.0000 mg | ORAL_TABLET | Freq: Two times a day (BID) | ORAL | Status: DC

## 2015-11-03 NOTE — Progress Notes (Signed)
Subjective:    Patient ID: Nancy Herrera, female    DOB: 29-Aug-1941, 74 y.o.   MRN: [id number]  HPI Here for uti symptoms and other problems   Of note -had a fall since last visit  Tripped over a chair that her daughter moved  Hit her face / nose  Has a laceration across nose  Does not think it is broken Skinned her knee L   Had a Tdap 12/13   Her anxiety is worse  Has not had her xanax in week -ran out early -was taking  Daughter had cancer - a lot of stress  (had a double mastectomy) - and doing implants  Son is getting married -she wants to be better to go to the wedding   Saw pulmonary for her COPD  Was changed from [facility] to [facility]- is helping some Has PFT on 21st    Urine symptoms - nauseated No bladder pain  Frequency  No burning Some urgency  No incontinence  No fever   Results for orders placed or performed in visit on 11/03/15  Urinalysis Dipstick  Result Value Ref Range   Color, UA Yellow    Clarity, UA Cloudy    Glucose, UA Neg.    Bilirubin, UA Neg.    Ketones, UA Neg.    Spec Grav, UA 1.010    Blood, UA Large    pH, UA 6.5    Protein, UA Neg.    Urobilinogen, UA 0.2    Nitrite, UA Neg.    Leukocytes, UA large (3+) (A) Negative    Patient Active Problem List   Diagnosis Date Noted  . Fall at home 11/03/2015  . Laceration of nose 11/03/2015  . COPD exacerbation ([place]) 09/24/2015  . SOB (shortness of breath) on exertion 09/14/2015  . Routine general medical examination at a health care facility 08/27/2015  . Estrogen deficiency 08/27/2015  . UTI (urinary tract infection) 12/21/2014  . Encounter for Medicare annual wellness exam 07/17/2014  . Colon cancer screening 07/17/2014  . Pedal edema 03/09/2014  . Other screening mammogram 11/18/2012  . Immunization counseling 08/02/2012  . 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 SHOULDER PAIN, BILATERAL 12/06/2010  . ALLERGIC RHINITIS 08/23/2010  . Hyperglycemia 08/23/2010  . Hyperlipidemia  04/10/2007  . Anxiety state 04/10/2007  . Smoker 04/10/2007  . DEPRESSION 04/10/2007  . Essential hypertension 04/10/2007  . GERD 04/10/2007  . DEGENERATIVE DISC DISEASE, LUMBOSACRAL SPINE 04/10/2007   Past Medical History  Diagnosis Date  . Hypertension   . Cancer ([place])   . Degenerative disc disease, lumbar   . Hyperlipidemia   . GERD (gastroesophageal reflux disease)   . Hyperglycemia     controlled by diet  . Anxiety   . Depression   . Allergy     allergic rhinitis   Past Surgical History  Procedure Laterality Date  . Cataract extraction     Social History  Substance Use Topics  . Smoking status: Current Every Day Smoker -- 0.50 packs/day    Types: Cigarettes  . Smokeless tobacco: None     Comment: smokes E cigarettes  . Alcohol Use: No   Family History  Problem Relation Age of Onset  . Cancer Mother     breast cancer  . Cancer Sister     breast cancer & lung cancer  . Breast cancer Daughter    No Known Allergies Current Outpatient Prescriptions on File Prior to Visit  Medication Sig Dispense Refill  . acetaminophen (  TYLENOL) 500 MG tablet OTC as directed     . albuterol (PROVENTIL HFA;VENTOLIN HFA) 108 (90 BASE) MCG/ACT inhaler Inhale 2 puffs into the lungs every 4 (four) hours as needed for wheezing or shortness of breath. 1 Inhaler 11  . aspirin 81 MG tablet Take 81 mg by mouth daily.      . furosemide (LASIX) 20 MG tablet TAKE 1 TABLET (20 MG TOTAL) BY MOUTH DAILY. 30 tablet 5  . glucose blood test strip To check glucose once daily and as needed for diabetes type 2     . glucose monitoring kit (FREESTYLE) monitoring kit To check glucose once daily and as needed for diabetes (250.0)     . Lancets MISC To check glucose once daily and as needed for diabetes type 2     . lisinopril (PRINIVIL,ZESTRIL) 10 MG tablet TAKE 1 TABLET BY MOUTH DAILY 90 tablet 0  . promethazine (PHENERGAN) 25 MG tablet TAKE 1 TABLET EVERY 8 HOURS AS NEEDED FOR NAUSEA 30 tablet 0  .  simvastatin (ZOCOR) 40 MG tablet TAKE 1 TABLET BY MOUTH EVERY DAY 90 tablet 3  . Umeclidinium-Vilanterol (ANORO ELLIPTA) 62.5-25 MCG/INH AEPB Inhale 1 puff into the lungs daily. 1 each 11   No current facility-administered medications on file prior to visit.     Review of Systems Review of Systems  Constitutional: Negative for fever, appetite change, and unexpected weight change.  Eyes: Negative for pain and visual disturbance.  Respiratory: Negative for cough and pos for shortness of breath that is slt improved from copd Cardiovascular: Negative for cp or palpitations   pos for improved pedal edema  Gastrointestinal: Negative for  diarrhea and constipation. pos for nausea  Genitourinary: pos for urgency and frequency. neg for hematuria or dysuria  Skin: Negative for pallor or rash  pos for laceration on nose  Neurological: Negative for weakness, light-headedness, numbness and headaches. pos for a trip and fall  Hematological: Negative for adenopathy. Does not bruise/bleed easily.  Psychiatric/Behavioral: Negative for dysphoric mood. The patient is very nervous/anxious.         Objective:   Physical Exam  Constitutional: She is oriented to person, place, and time. She appears well-developed and well-nourished. No distress.  Anxious but well appearing   HENT:  Head: Normocephalic and atraumatic.  Right Ear: External ear normal.  Left Ear: External ear normal.  Nose: Nose normal.  Mouth/Throat: Oropharynx is clear and moist. No oropharyngeal exudate.  No sinus tenderness No temporal tenderness  No TMJ tenderness  Laceration on nose noted with ecchymosis under eyes  Nares are patent  Eyes: Conjunctivae and EOM are normal. Pupils are equal, round, and reactive to light. Right eye exhibits no discharge. Left eye exhibits no discharge. No scleral icterus.  No nystagmus  Neck: Normal range of motion and full passive range of motion without pain. Neck supple. No JVD present. Carotid  bruit is not present. No tracheal deviation present. No thyromegaly present.  Cardiovascular: Normal rate, regular rhythm and normal heart sounds.   No murmur heard. Pulmonary/Chest: Effort normal and breath sounds normal. No respiratory distress. She has no wheezes. She has no rales.  Diffusely distant bs   Abdominal: Soft. Bowel sounds are normal. She exhibits no distension and no mass. There is no tenderness. There is no rebound and no guarding.  No cva tenderness  No suprapubic tenderness  Musculoskeletal: She exhibits no edema or tenderness.  Lymphadenopathy:    She has no cervical adenopathy.  Neurological: She  is alert and oriented to person, place, and time. She has normal strength and normal reflexes. She displays tremor. She displays no atrophy. No cranial nerve deficit or sensory deficit. She exhibits normal muscle tone. She displays a negative Romberg sign. Coordination and gait normal.  No focal cerebellar signs   Tremor from anxiety  Skin: Skin is warm and dry. No rash noted. No pallor.  2 cm linear laceration on R nasal bridge with well approx wound edges and no redness/ slt swelling  Ecchymosis under both eyes   Psychiatric: Her speech is normal and behavior is normal. Thought content normal. Her mood appears anxious. Her affect is not blunt, not labile and not inappropriate. She does not exhibit a depressed mood.  Very anxious today Pleasant and talkative Anxious to discuss stressors           Assessment & Plan:   Problem List Items Addressed This Visit      Respiratory   COPD exacerbation ([place])    slt improvement with Anoro , continues albuterol and continues to smoke  For PFT upcoming Rev her pulmonary note Not ready to quit smoking yet due to stressors         Genitourinary   UTI (urinary tract infection) - Primary    Cover with cipro  Culture pending  Enc fluid intake  Update if not starting to improve in a week or if worsening        Relevant  Medications   mupirocin ointment (BACTROBAN) 2 %   Other Relevant Orders   Urine culture     Other   Anxiety state    Pt is very anxious today- with elevated pulse and bp and tremor  Reviewed stressors/ coping techniques/symptoms/ support sources/ tx options and side effects in detail today Many stressors incl daughter with cancer and her own health Was taking xanax bid w/o telling me - then had a fall  Will not advance xanax- back to qd at night and rev fall risk in detail Inc paxil to 60 mg daily  Handout given-meds rev in detail  F/u planned  Declines counseling at this time        Relevant Medications   PARoxetine (PAXIL) 40 MG tablet   ALPRAZolam (XANAX) 0.5 MG tablet   Fall at home    Pt tripped over a chair that a fam member moved out of it's usual place Pt feels balance is ok  Laceration on nose noted -see that a/p No symptoms of concussion or nasal fx (disc what to watch for)  Given handout on fall prev  Disc limitation of xanax in light of fall risk -see a/p for anxiety      Laceration of nose    From a fall  Too late to suture (2 days)- wound edges aligned and it is clean  No indication of nasal fx  bactroban px to use bid  inst to clean with soap and water and use loose bandage Update if s/s of infection or any trouble breathing  Fall precautions discussed       SOB (shortness of breath) on exertion    Copd being w/o but pulmonary Will plan 2D echo after her pfts as well  Edema is about the same with diuretic       Other Visit Diagnoses    Dysuria        Relevant Orders    Urinalysis Dipstick (Completed)

## 2015-11-03 NOTE — Patient Instructions (Addendum)
For anxiety increase paroxetine (paxil) to 1 1/2 pills once daily - this should help a lot Xanax is the other medicine for anxiety-take no more than one pill daily - it can cause falls so use caution For uti -take cipro as directed and we will send your urine for a culture Drink lots of water  Keep the cut on your nose clean and dry / use the bactroban (mucopuricin) ointment twice daily until it heals- a loose band aid is fine  After your pulmonary tests -we will set up the heart ultrasound

## 2015-11-03 NOTE — Progress Notes (Signed)
Pre visit review using our clinic review tool, if applicable. No additional management support is needed unless otherwise documented below in the visit note. 

## 2015-11-04 ENCOUNTER — Telehealth: Payer: Self-pay

## 2015-11-04 NOTE — Assessment & Plan Note (Signed)
Cover with cipro  Culture pending  Enc fluid intake  Update if not starting to improve in a week or if worsening

## 2015-11-04 NOTE — Assessment & Plan Note (Signed)
From a fall  Too late to suture (2 days)- wound edges aligned and it is clean  No indication of nasal fx  bactroban px to use bid  inst to clean with soap and water and use loose bandage Update if s/s of infection or any trouble breathing  Fall precautions discussed

## 2015-11-04 NOTE — Telephone Encounter (Signed)
Pt received rx for alprazolam on 11/03/15; Spoke with Maudie Mercury at [facility]; rx was taken to [facility] and put on hold but  Cannot be filled due to federal guidelines until 11/09/15. Maudie Mercury said was filled at another pharmacy last month so does not know exact day last filled. Pt said she had been taking 2 a day due to her daughter having breast CA. Pt is not sure how many days pt took 2 alprazolam daily but is out of med completely now. Pt said she feels jittery. Pt wants to know if there is anything Dr Glori Bickers can do to get pt med before 11/09/15.

## 2015-11-04 NOTE — Assessment & Plan Note (Signed)
Copd being w/o but pulmonary Will plan 2D echo after her pfts as well  Edema is about the same with diuretic

## 2015-11-04 NOTE — Assessment & Plan Note (Signed)
Pt is very anxious today- with elevated pulse and bp and tremor  Reviewed stressors/ coping techniques/symptoms/ support sources/ tx options and side effects in detail today Many stressors incl daughter with cancer and her own health Was taking xanax bid w/o telling me - then had a fall  Will not advance xanax- back to qd at night and rev fall risk in detail Inc paxil to 60 mg daily  Handout given-meds rev in detail  F/u planned  Declines counseling at this time

## 2015-11-04 NOTE — Assessment & Plan Note (Signed)
slt improvement with Anoro , continues albuterol and continues to smoke  For PFT upcoming Rev her pulmonary note Not ready to quit smoking yet due to stressors

## 2015-11-04 NOTE — Telephone Encounter (Signed)
I am aware of all that - aware she took it bid for a while and did refill it yesterday aware she was out  We addressed anxiety at her visit We inc her paxil to help this  She is to go back to one daily - so please inst pharmacy to fill her xanax Please make sure pt understands all this and also has started to inc her paxil as inst at our office visit and in AVS Thanks

## 2015-11-04 NOTE — Assessment & Plan Note (Signed)
Pt tripped over a chair that a fam member moved out of it's usual place Pt feels balance is ok  Laceration on nose noted -see that a/p No symptoms of concussion or nasal fx (disc what to watch for)  Given handout on fall prev  Disc limitation of xanax in light of fall risk -see a/p for anxiety

## 2015-11-05 NOTE — Telephone Encounter (Signed)
Pharmacist advise of Dr. Marliss Coots comments and will call or txt pt once Rx ready, pt advise and advise her to start back taking xanax once a day and to take increase dose of paxil, pt verbalized understanding

## 2015-11-06 LAB — URINE CULTURE: Colony Count: 85000

## 2015-11-19 ENCOUNTER — Other Ambulatory Visit: Payer: Self-pay | Admitting: Family Medicine

## 2015-11-22 ENCOUNTER — Telehealth: Payer: Self-pay | Admitting: *Deleted

## 2015-11-22 NOTE — Telephone Encounter (Signed)
Thanks - I will be on the look out for report when done

## 2015-11-22 NOTE — Telephone Encounter (Signed)
Spoke with Nancy Herrera and she advise me that she did cancel her PFT appts due to her schedule, and has not called back to reschedule the appt., I advise Nancy Herrera that Dr. Glori Bickers wants her to have the PFT done before she orders an Echo on her, Nancy Herrera verbalized understanding and said she will call her pulmo doc to reschedule her appt.

## 2015-11-22 NOTE — Telephone Encounter (Signed)
-----   Message from Abner Greenspan, MD sent at 11/22/2015  8:46 AM EST ----- I was waiting on this pt to get her Pulmonary function tests before I ordered her echocardiogram but it looks like she did not get it (it was ordered by pulmonary) I don't see it under appts either Could you check in with her re: the status of her PFT tests ?   Thanks

## 2015-11-29 ENCOUNTER — Other Ambulatory Visit: Payer: Self-pay | Admitting: Family Medicine

## 2015-11-29 ENCOUNTER — Telehealth: Payer: Self-pay | Admitting: Family Medicine

## 2015-11-29 NOTE — Telephone Encounter (Signed)
Refill sent to pharmacy as instructed. 

## 2015-11-29 NOTE — Telephone Encounter (Signed)
Pt request shingles vaccine to be scheduled. Per 08/27/15 annual exam and 09/18/12 phone note pt had shingles vaccine at [facility]. Pt remembered having vaccine and ended the call. I added to immunization list.

## 2015-11-29 NOTE — Telephone Encounter (Signed)
Receive refill request electronically Last refill 09/28/15 #30 Last office visit 11/03/15 Is it okay tor refill?

## 2015-11-29 NOTE — Telephone Encounter (Signed)
Please refill times 3 -thanks 

## 2015-12-01 ENCOUNTER — Other Ambulatory Visit: Payer: Self-pay | Admitting: Family Medicine

## 2015-12-01 NOTE — Telephone Encounter (Signed)
Px written for call in   

## 2015-12-01 NOTE — Telephone Encounter (Signed)
Electronic refill request, last refilled on 11/03/15 #30 with 0 refills, please advise

## 2015-12-01 NOTE — Telephone Encounter (Signed)
Rx called in as prescribed 

## 2015-12-02 ENCOUNTER — Ambulatory Visit
Admission: RE | Admit: 2015-12-02 | Discharge: 2015-12-02 | Disposition: A | Discharge status: Home / Self Care | Payer: Medicare Other | Source: Ambulatory Visit | Attending: Family Medicine | Admitting: Family Medicine

## 2015-12-02 DIAGNOSIS — Z1231 Encounter for screening mammogram for malignant neoplasm of breast: Secondary | ICD-10-CM

## 2015-12-02 DIAGNOSIS — E2839 Other primary ovarian failure: Secondary | ICD-10-CM

## 2015-12-02 LAB — HM DEXA SCAN: HM DEXA SCAN: BORDERLINE

## 2015-12-02 LAB — HM MAMMOGRAPHY: HM MAMMO: NORMAL

## 2015-12-05 ENCOUNTER — Encounter: Payer: Self-pay | Admitting: Family Medicine

## 2015-12-05 DIAGNOSIS — M8580 Other specified disorders of bone density and structure, unspecified site: Secondary | ICD-10-CM | POA: Insufficient documentation

## 2015-12-08 ENCOUNTER — Encounter: Payer: Self-pay | Admitting: *Deleted

## 2015-12-08 ENCOUNTER — Encounter: Payer: Self-pay | Admitting: Family Medicine

## 2016-02-14 ENCOUNTER — Other Ambulatory Visit: Payer: Self-pay | Admitting: Family Medicine

## 2016-02-14 NOTE — Telephone Encounter (Signed)
Please refill once  Ask her to f/u with me so I can check in with her after her husband gets out of the hospital  Thanks

## 2016-02-14 NOTE — Telephone Encounter (Signed)
Electronic refill request, last refilled on 11/29/15 #30 with 2 additional refills, please advise

## 2016-02-15 NOTE — Telephone Encounter (Signed)
Rx sent and pt notified of Dr. Marliss Coots comments, pt declined to schedule an appt with Dr. Glori Bickers now she will call back once her husband has been d/c from the hospital

## 2016-02-18 ENCOUNTER — Telehealth: Payer: Self-pay

## 2016-02-18 NOTE — Telephone Encounter (Signed)
Nancy Herrera pts daughter (DPR signed) left v/m; Nancy Herrera is concerned about pt; pt does not care about her appearance , pt is not bathing, doesn't eat regularly, pt does not take Xanax correctly. Pt is taking too much Xanax. Sometimes pt sits around with her face in her hands, and on and off pt has slurred speech and glassy eyes.Nancy Herrera knows pt takes more than one Xanax a day. Nancy Herrera wants to know if Dr Glori Bickers could call her to discuss this or does Nancy Herrera need to come to office to talk with Dr Glori Bickers. Nancy Herrera does not think pt is SI/HI.Pt last seen 09/24/15 as f/u. Nancy Herrera request cb.

## 2016-02-18 NOTE — Telephone Encounter (Signed)
I recently asked her to come in for a visit when her husband was home from the hospital - I am also worried about her  Would she be able to accompany her to an appt?  This would be most helpful (30 min if poss)   Let me know  That would be my first choice

## 2016-02-21 NOTE — Telephone Encounter (Signed)
Spoke with daughter and she is very worried about pt taking to much xanax. Pt's daughter said pt has fallen 3 x recently and she thinks she is taking to much xanax. Daughter wants to come in with pt for an appt and she said she can come in before pt's spouse it d/c from the hospital. Daughter said pt is just sitting around taking xanax, drinking soda and smoking and not doing anything else. Daughter request that we don't fill Rx until pt comes in for an appt

## 2016-02-21 NOTE — Telephone Encounter (Signed)
Please tell pt we cannot refill until she comes in for an appointment - 30 min please If her family member could come-that would be helpful Thanks for letting me know

## 2016-02-21 NOTE — Telephone Encounter (Signed)
Pt left v/m requesting refill xanax; spoke with Altha Harm at [facility]. Pt last got # 30 on 01/29/16 and does have available refills but too early.Please advise.

## 2016-02-21 NOTE — Telephone Encounter (Signed)
Pt scheduled appt and left voicemail letting pt's daughter know the appt date/time

## 2016-02-22 ENCOUNTER — Encounter: Payer: Self-pay | Admitting: Family Medicine

## 2016-02-22 ENCOUNTER — Encounter: Payer: Self-pay | Admitting: Radiology

## 2016-02-22 ENCOUNTER — Ambulatory Visit (INDEPENDENT_AMBULATORY_CARE_PROVIDER_SITE_OTHER): Payer: Medicare Other | Admitting: Family Medicine

## 2016-02-22 VITALS — BP 135/80 | HR 109 | Temp 98.5°F | Ht 66.0 in | Wt 145.8 lb

## 2016-02-22 DIAGNOSIS — F411 Generalized anxiety disorder: Secondary | ICD-10-CM

## 2016-02-22 DIAGNOSIS — R0602 Shortness of breath: Secondary | ICD-10-CM

## 2016-02-22 DIAGNOSIS — F17200 Nicotine dependence, unspecified, uncomplicated: Secondary | ICD-10-CM

## 2016-02-22 DIAGNOSIS — Z720 Tobacco use: Secondary | ICD-10-CM | POA: Diagnosis not present

## 2016-02-22 DIAGNOSIS — Z79899 Other long term (current) drug therapy: Secondary | ICD-10-CM

## 2016-02-22 DIAGNOSIS — R350 Frequency of micturition: Secondary | ICD-10-CM

## 2016-02-22 DIAGNOSIS — I10 Essential (primary) hypertension: Secondary | ICD-10-CM | POA: Diagnosis not present

## 2016-02-22 LAB — POC URINALSYSI DIPSTICK (AUTOMATED)
BILIRUBIN UA: NEGATIVE
Glucose, UA: NEGATIVE
KETONES UA: NEGATIVE
LEUKOCYTES UA: NEGATIVE
NITRITE UA: NEGATIVE
PH UA: 7
Spec Grav, UA: 1.01
UROBILINOGEN UA: 1

## 2016-02-22 MED ORDER — ALPRAZOLAM 0.5 MG PO TABS: ORAL_TABLET | ORAL | Status: DC

## 2016-02-22 NOTE — Assessment & Plan Note (Signed)
Per pt improved  She did cut back on smoking for a while  Declines PFT or other testing for now  Unsure if she is using her inhaler-her daughter will do a med review before f/u

## 2016-02-22 NOTE — Assessment & Plan Note (Signed)
bp in fair control at this time  BP Readings from Last 1 Encounters:  02/22/16 135/80   No changes needed Disc lifstyle change with low sodium diet and exercise  This was improved after sitting and calming down

## 2016-02-22 NOTE — Patient Instructions (Addendum)
Think about counseling in the future- talk to your family and your pastor and your cat  Write in a journal also  We will send your urine for a culture - drink more water , we will advise further when that comes back  Go home and do a medicine review  Count pills and bottles and make a pill box Make sure you are taking paxil 60 mg daily and xanax just ONCE daily  Follow up in 3-4 weeks and we will make changes if necessary

## 2016-02-22 NOTE — Assessment & Plan Note (Signed)
ua dipped for heme  No dysuria  Suggested inc water intake  Sent for urine cx and update

## 2016-02-22 NOTE — Assessment & Plan Note (Signed)
Much worsened in times of stress ie: husband is in the hospital- if afraid to be alone  Has family -supportive Reviewed stressors/ coping techniques/symptoms/ support sources/ tx options and side effects in detail today  Has taken 2 xanax daily inst of one and ran out 2 weeks ago (should not be in tox screen today)  Had prev dec that to one daily due to fall risk  Daughter will rev meds and make sure she is taking her paxil 60 mg daily   If so -and no imp will consider change to SNRI  She declines counseling Enc journaling / talking to friends and family and more social interaction  Disc imp of exercise and self care   F/u 3-4 wk with her daughter - who will take over her medicines  >25 minutes spent in face to face time with patient, >50% spent in counselling or coordination of care

## 2016-02-22 NOTE — Progress Notes (Signed)
Pre visit review using our clinic review tool, if applicable. No additional management support is needed unless otherwise documented below in the visit note. 

## 2016-02-22 NOTE — Assessment & Plan Note (Signed)
Pt's daughter is here with her today - she is having more anxiety and stressors and may not be taking her meds as ordered Reviewed all today Daughter voiced understanding re: role and dosing of all meds -will go home and count pills/bottles and start to organize and set out her mother's medications daily and weekly  This should be helpful She will let me know if there are any differences/esp in anxiety medicine

## 2016-02-22 NOTE — Progress Notes (Signed)
Subjective:    Patient ID: Nancy Herrera, female    DOB: March 14, 1941, 75 y.o.   MRN: [id number]  HPI Here for urinary symptoms and also anxiety   Urinary- urgency - feels like she has to go but does not have to  No dysuria  No blood in urine  No pressure or pain  No flank pain  Is drinking fluids - perhaps not enough water  Some coke - she waters it down  One cup of coffee in the am   Results for orders placed or performed in visit on 02/22/16  POCT Urinalysis Dipstick (Automated)  Result Value Ref Range   Color, UA Yellow    Clarity, UA Hazy    Glucose, UA Neg.    Bilirubin, UA Neg.    Ketones, UA Neg.    Spec Grav, UA 1.010    Blood, UA 10 Ery/uL    pH, UA 7.0    Protein, UA Trace    Urobilinogen, UA 1.0    Nitrite, UA Neg.    Leukocytes, UA Negative Negative      Daughter is here with her today   Last time we disc anxiety  We went down on xanax to once daily due to a fall / fall risk  Went up on paxil to 34   That seemed to help until her husband went into the hospital  He is having a lot of medical problems  The last 2 weeks she has increased xanax to 2 pills per day   Daughter wonders if she may be confused about her medicine   Also has chronic anxiety   Her sob is actually a bit better  Smoking has increased to just over 1/2 ppd  Not ready to quit   Patient Active Problem List   Diagnosis Date Noted  . Encounter for medication review 02/22/2016  . Frequent urination 02/22/2016  . Osteopenia 12/05/2015  . Fall at home 11/03/2015  . COPD exacerbation ([place]) 09/24/2015  . SOB (shortness of breath) on exertion 09/14/2015  . Routine general medical examination at a health care facility 08/27/2015  . Estrogen deficiency 08/27/2015  . UTI (urinary tract infection) 12/21/2014  . Encounter for Medicare annual wellness exam 07/17/2014  . Colon cancer screening 07/17/2014  . Pedal edema 03/09/2014  . Other screening mammogram 11/18/2012  . Immunization  counseling 08/02/2012  . 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 SHOULDER PAIN, BILATERAL 12/06/2010  . ALLERGIC RHINITIS 08/23/2010  . Hyperglycemia 08/23/2010  . Hyperlipidemia 04/10/2007  . Generalized anxiety disorder 04/10/2007  . Smoker 04/10/2007  . DEPRESSION 04/10/2007  . Essential hypertension 04/10/2007  . GERD 04/10/2007  . DEGENERATIVE DISC DISEASE, LUMBOSACRAL SPINE 04/10/2007   Past Medical History  Diagnosis Date  . Hypertension   . Cancer ([place])   . Degenerative disc disease, lumbar   . Hyperlipidemia   . GERD (gastroesophageal reflux disease)   . Hyperglycemia     controlled by diet  . Anxiety   . Depression   . Allergy     allergic rhinitis   Past Surgical History  Procedure Laterality Date  . Cataract extraction     Social History  Substance Use Topics  . Smoking status: Current Every Day Smoker -- 0.50 packs/day    Types: Cigarettes  . Smokeless tobacco: None  . Alcohol Use: No   Family History  Problem Relation Age of Onset  . Cancer Mother     breast cancer  . Cancer Sister  breast cancer & lung cancer  . Breast cancer Daughter    No Known Allergies Current Outpatient Prescriptions on File Prior to Visit  Medication Sig Dispense Refill  . acetaminophen (TYLENOL) 500 MG tablet OTC as directed     . albuterol (PROVENTIL HFA;VENTOLIN HFA) 108 (90 BASE) MCG/ACT inhaler Inhale 2 puffs into the lungs every 4 (four) hours as needed for wheezing or shortness of breath. 1 Inhaler 11  . aspirin 81 MG tablet Take 81 mg by mouth daily.      . furosemide (LASIX) 20 MG tablet TAKE 1 TABLET (20 MG TOTAL) BY MOUTH DAILY. 30 tablet 5  . glucose blood test strip To check glucose once daily and as needed for diabetes type 2     . glucose monitoring kit (FREESTYLE) monitoring kit To check glucose once daily and as needed for diabetes (250.0)     . Lancets MISC To check glucose once daily and as needed for diabetes type 2     . lisinopril  (PRINIVIL,ZESTRIL) 10 MG tablet TAKE 1 TABLET BY MOUTH DAILY 90 tablet 1  . mupirocin ointment (BACTROBAN) 2 % Place 1 application into the nose 2 (two) times daily. Apply to the cut on your nose twice daily 15 g 0  . PARoxetine (PAXIL) 40 MG tablet Take 1 1/2 pills by mouth once daily 45 tablet 11  . promethazine (PHENERGAN) 25 MG tablet TAKE 1 TABLET EVERY 8 HOURS AS NEEDED FOR NAUSEA 30 tablet 0  . simvastatin (ZOCOR) 40 MG tablet TAKE 1 TABLET BY MOUTH EVERY DAY 90 tablet 3  . Umeclidinium-Vilanterol (ANORO ELLIPTA) 62.5-25 MCG/INH AEPB Inhale 1 puff into the lungs daily. 1 each 11   No current facility-administered medications on file prior to visit.     Review of Systems    Review of Systems  Constitutional: Negative for fever, appetite change, and unexpected weight change.  Eyes: Negative for pain and visual disturbance.  Respiratory: Negative for cough and pos for improved  shortness of breath.   Cardiovascular: Negative for cp or palpitations    Gastrointestinal: Negative for , diarrhea and constipation. pos for nausea (no vomiting) from anxiety Genitourinary: pos for urgency and frequency. neg for dysuria or hematuria  Skin: Negative for pallor or rash   Neurological: Negative for weakness, light-headedness, numbness and headaches.  Hematological: Negative for adenopathy. Does not bruise/bleed easily.  Psychiatric/Behavioral: Negative for dysphoric mood. The patient is very nervous/anxious.  neg for SI, pos for short term memory problems and slow cognition likely from anxiety    Objective:   Physical Exam  Constitutional: She appears well-developed and well-nourished. No distress.  Very anxious and frail appearing elderly female   HENT:  Head: Normocephalic and atraumatic.  Mouth/Throat: Oropharynx is clear and moist.  Eyes: Conjunctivae and EOM are normal. Pupils are equal, round, and reactive to light.  Neck: Normal range of motion. Neck supple. No JVD present. Carotid  bruit is not present. No thyromegaly present.  Cardiovascular: Normal rate, regular rhythm, normal heart sounds and intact distal pulses.  Exam reveals no gallop.   Pulmonary/Chest: Effort normal and breath sounds normal. No respiratory distress. She has no wheezes. She has no rales.  No crackles  Diffusely distant bs   Abdominal: Soft. Bowel sounds are normal. She exhibits no distension, no abdominal bruit and no mass. There is no tenderness.  Musculoskeletal: She exhibits no edema or tenderness.  Lymphadenopathy:    She has no cervical adenopathy.  Neurological: She is alert.  She has normal reflexes. She displays tremor. No cranial nerve deficit. She exhibits normal muscle tone. Coordination normal.  Hand tremor improves as she talks and relaxes   Skin: Skin is warm and dry. No rash noted.  Psychiatric: Her mood appears anxious. Her affect is not blunt, not labile and not inappropriate. Her speech is tangential. She is agitated. She is not aggressive and not actively hallucinating. Thought content is not paranoid. Cognition and memory are impaired. She expresses no homicidal and no suicidal ideation.  Short term memory seems poor today- this may be due to her mod-severe anxiety  Says last xanax was 2 weeks ago   Confuses easily  Improves as she relaxes Repeats questions Supportive daughter is present  She is attentive.          Assessment & Plan:   Problem List Items Addressed This Visit      Cardiovascular and Mediastinum   Essential hypertension - Primary    bp in fair control at this time  BP Readings from Last 1 Encounters:  02/22/16 135/80   No changes needed Disc lifstyle change with low sodium diet and exercise  This was improved after sitting and calming down          Other   Encounter for medication review    Pt's daughter is here with her today - she is having more anxiety and stressors and may not be taking her meds as ordered Reviewed all today Daughter  voiced understanding re: role and dosing of all meds -will go home and count pills/bottles and start to organize and set out her mother's medications daily and weekly  This should be helpful She will let me know if there are any differences/esp in anxiety medicine       Frequent urination    ua dipped for heme  No dysuria  Suggested inc water intake  Sent for urine cx and update       Relevant Orders   Urine culture   Generalized anxiety disorder    Much worsened in times of stress ie: husband is in the hospital- if afraid to be alone  Has family -supportive Reviewed stressors/ coping techniques/symptoms/ support sources/ tx options and side effects in detail today  Has taken 2 xanax daily inst of one and ran out 2 weeks ago (should not be in tox screen today)  Had prev dec that to one daily due to fall risk  Daughter will rev meds and make sure she is taking her paxil 60 mg daily   If so -and no imp will consider change to SNRI  She declines counseling Enc journaling / talking to friends and family and more social interaction  Disc imp of exercise and self care   F/u 3-4 wk with her daughter - who will take over her medicines  >25 minutes spent in face to face time with patient, >50% spent in counselling or coordination of care       Smoker    Disc in detail risks of smoking and possible outcomes including copd, vascular/ heart disease, cancer , respiratory and sinus infections  Pt voices understanding She started smoking more due to stress- less than 1ppd however  States she is not ready to quit         SOB (shortness of breath) on exertion    Per pt improved  She did cut back on smoking for a while  Declines PFT or other testing for now  Unsure if she  is using her inhaler-her daughter will do a med review before f/u       Other Visit Diagnoses    Urinary frequency        Relevant Orders    POCT Urinalysis Dipstick (Automated) (Completed)

## 2016-02-22 NOTE — Assessment & Plan Note (Signed)
Disc in detail risks of smoking and possible outcomes including copd, vascular/ heart disease, cancer , respiratory and sinus infections  Pt voices understanding She started smoking more due to stress- less than 1ppd however  States she is not ready to quit

## 2016-02-24 ENCOUNTER — Telehealth: Payer: Self-pay | Admitting: Family Medicine

## 2016-02-24 ENCOUNTER — Other Ambulatory Visit: Payer: Self-pay | Admitting: Family Medicine

## 2016-02-24 LAB — URINE CULTURE

## 2016-02-24 NOTE — Telephone Encounter (Signed)
If she thinks she is doing ok without it -do not re start it

## 2016-02-24 NOTE — Telephone Encounter (Signed)
Please refill times 2 

## 2016-02-24 NOTE — Telephone Encounter (Signed)
Nancy Herrera pts daughter (DPR signed) left v/m; anita said pharmacy will not fill xanax until 03/02/16 and Nancy Herrera wants to know if pt is out of med that long would it be OK for pt not to restart the Xanax. Nancy Herrera prefers if pt does not take the Xanax but does not want to do harm to pt. Nancy Herrera request cb. No future appt scheduled.

## 2016-02-24 NOTE — Telephone Encounter (Signed)
Daughter really doesn't want pt back on med if she doesn't need to be, do you advise pt stay on med or should she just stop for good since she has been out for over almost 2 weeks, please advise before I call in the override

## 2016-02-24 NOTE — Telephone Encounter (Signed)
done

## 2016-02-24 NOTE — Telephone Encounter (Signed)
Please call the pharmacy and give them the verbal order to refill xanax now times one- I was prev aware she had taken more than I knew and the situation was dealt with  Make sure they make their f/u appt per our last visit   Thanks

## 2016-02-24 NOTE — Telephone Encounter (Signed)
Med was refilled on 02/15/16 #30 with 0 refills, please advise

## 2016-02-25 NOTE — Telephone Encounter (Signed)
Left voicemail on Anita's phone letting her know Dr. Marliss Coots comments. Advise her if she does want [place] to call pharmacy and do an override on the xanax if her mother seems like she needs it to call us back and we can call the pharmacy to do the override, but if the pt is stable without the med then she doesn't have to restart it

## 2016-02-25 NOTE — Telephone Encounter (Signed)
Nancy Herrera returned your call Best number [phone]

## 2016-02-25 NOTE — Telephone Encounter (Signed)
Left voicemail requesting pt's daughter to call office back

## 2016-03-06 ENCOUNTER — Telehealth: Payer: Self-pay | Admitting: Family Medicine

## 2016-03-06 NOTE — Telephone Encounter (Signed)
[place]  Patient Name: Nancy Herrera  DOB: [birth year]    Initial Comment Caller states mother is having vaginal bleeding, how often is unknown, also has medication questions and would like to get recent lab results   Nurse Assessment  Nurse: Wayne Sever, RN, Tillie Rung Date/Time Nancy Herrera Time): 03/06/2016 1:24:42 PM  Confirm and document reason for call. If symptomatic, describe symptoms. You must click the next button to save text entered. ---Caller states she only wants to speak with the doctor's assistant. She does not understand why I am calling her back she wants test results. Denies any active symptoms, she states she tried to explain it, but they transferred her over to me.  Has the patient traveled out of the country within the last 30 days? ---Not Applicable  Does the patient have any new or worsening symptoms? ---No  Please document clinical information provided and list any resource used. ---putting note in Epic for them to call her back     Guidelines    Guideline Title Affirmed Question Affirmed Notes       Final Disposition User        Comments  Wants Dr Alba Cory assistant to call her back. Denies needing triage.

## 2016-03-06 NOTE — Telephone Encounter (Addendum)
Saturday, Nancy Herrera found a pad with some blood on it. Her mother said  She strained too hard with a bowel movement and had the bleeding. Patient says she is not having anymore issues with that.  Spoke to Nancy Herrera. I advised her of the urine culture results.  She wanted to know if the patient had called for a refill on Xanax. I advised her of the last message from 02-24-16.  Advised her she would need to make an OV to discuss smoking cessation options.

## 2016-03-09 ENCOUNTER — Telehealth: Payer: Self-pay | Admitting: Family Medicine

## 2016-03-09 NOTE — Telephone Encounter (Signed)
I cannot continue to refill her xanax if she does not let her daughter distribute it  Her drug screen was clear of xanax as expected (she had been off of it at that time)- it was positive for a med called restoril (a type of sleeping pill)-I did not see it on her med list- did she find any of this when she reviewed her med bottles ?  (also known as temazepam)

## 2016-03-09 NOTE — Telephone Encounter (Signed)
Pt's daughter advise of Dr. Marliss Coots comments then I called pt and advised her that her Daughter has to take over xanax too and if not then Dr. Glori Bickers will not fill med again.   FYI daughter thinks pt is taking more med then prescribed because her balance has started being off and she started to fall and stagger, daughter will count pills of xanax when she gets them to see how much her mother has taken so far, if there is any issues daughter will call us back

## 2016-03-09 NOTE — Telephone Encounter (Signed)
Daughter left voicemail at Triage, daughter has been filling pt's pill box but pt went behind daughter's back and had the xanax refilled and is refusing to let daughter put the med in the pill box, daughter said she was going to put one pill a day in the box, but pt refused to give her the med and said she will manage her own xanax medication. Daughter is worried that pt will take to much like she did in the past, daughter also wants to know the results of pt's drug screen (not in EPIC), please advise

## 2016-03-26 ENCOUNTER — Other Ambulatory Visit: Payer: Self-pay | Admitting: Family Medicine

## 2016-03-27 NOTE — Telephone Encounter (Signed)
Last refilled on 02/24/16 #30 with 2 additional refills, please advise

## 2016-03-27 NOTE — Telephone Encounter (Signed)
Pt notified Rx sent to pharmacy. Pt declined to schedule f/u now due to husband being back in hospital, pt said she is doing okay but her nausea is "acting up" because of the stress of her husband being in the hospital, but the phenergan is working and helping her sxs

## 2016-03-27 NOTE — Telephone Encounter (Signed)
Please refill times 2  Please ask her how she is feeling  She is due for a follow up appointment - I know her husband has been in the hospital however

## 2016-03-28 ENCOUNTER — Telehealth: Payer: Self-pay

## 2016-03-28 NOTE — Telephone Encounter (Signed)
Called daughter Susy Frizzle and she advise me that after last phone note about xanax, that pt still refused to give her the xanax Rx so daughter came to her house unannounced and pt had the xanax Rx out so daughter did get the xanax from pt and realized that pt has taken way more then prescribed. Daughter put one tab in each day of pt's med box for the week on Sunday but when daughter came back the next day pt had taken all the xanax out of the pill box and pt them up, and couldn't explain to her kids why she did that. Also pt hasn't been eating, she has been having urinary incontinence, and diarrhea, family questions if it's related to meds. Pt's daughter started coming over every day and gives pt one xanax a day. Daughter said pt is due for the refill of med tomorrow and her daughter is going to get xanax refilled tomorrow when it's due pt didn't like that so that's why she called [place]. Pt's children are worried about pt and thinks she needs to go to a rehab for the med, they can't tell if pt is taking to much meds or if she is suffering from dementia because pt's speech is "all over the place" most of the time they can't understand what pt is saying.   Daughter said she will get xanax refilled tomorrow since it's due but will hold off on giving pt med until she is seen tomorrow by Dr. Hendricks Milo with Dr. Glori Bickers about pt's issues and she wanted pt to be seen so OV appt scheduled tomorrow at 4:45pm (61mn appt)

## 2016-03-28 NOTE — Telephone Encounter (Signed)
Pt left v/m; pt feels jittery in side; pt requesting med for anxiety.does pt need to be seen?

## 2016-03-28 NOTE — Telephone Encounter (Signed)
Thanks - I will see them then- if you can-please ask pt's daughter to bring her pill bottles so we can double check her dosing compared to med list  I will see them then

## 2016-03-28 NOTE — Telephone Encounter (Signed)
Please ask if she is taking her paxil- we disc this at her last visit with her daughter (there was a ? As to whether she was taking her medication appropriately.  You may need to talk with her daughter -they were supposed to f/u 1-2 mo after last appt also (but her husband went back into the hospital)

## 2016-03-29 ENCOUNTER — Ambulatory Visit (INDEPENDENT_AMBULATORY_CARE_PROVIDER_SITE_OTHER): Payer: Medicare Other | Admitting: Family Medicine

## 2016-03-29 ENCOUNTER — Encounter: Payer: Self-pay | Admitting: Family Medicine

## 2016-03-29 VITALS — BP 140/68 | HR 110 | Temp 98.4°F | Ht 66.0 in | Wt 139.5 lb

## 2016-03-29 DIAGNOSIS — F411 Generalized anxiety disorder: Secondary | ICD-10-CM

## 2016-03-29 DIAGNOSIS — I10 Essential (primary) hypertension: Secondary | ICD-10-CM | POA: Diagnosis not present

## 2016-03-29 DIAGNOSIS — Z720 Tobacco use: Secondary | ICD-10-CM

## 2016-03-29 DIAGNOSIS — F17200 Nicotine dependence, unspecified, uncomplicated: Secondary | ICD-10-CM

## 2016-03-29 MED ORDER — VENLAFAXINE HCL ER 75 MG PO CP24: 75.0000 mg | ORAL_CAPSULE | Freq: Every day | ORAL | Status: DC

## 2016-03-29 NOTE — Progress Notes (Signed)
Pre visit review using our clinic review tool, if applicable. No additional management support is needed unless otherwise documented below in the visit note. 

## 2016-03-29 NOTE — Telephone Encounter (Signed)
Daughter will bring pill box to appt

## 2016-03-29 NOTE — Patient Instructions (Addendum)
You are much clearer off xanax today -stay off of it  Stop paxil  Start effexor (a different medicine for anxiety) take one pill daily  If any intolerable side effects or if you feel worse - let us know Go to [place] if you feel you are in crisis or want to hurt yourself  Do work on smoking -day by day   Use the phenergan as needed for nausea -with caution of sedation   Please get all pill bottles out of the house and have family administer your medicine daily   Stop at check out for referral to psychiatry and counseling as well

## 2016-03-29 NOTE — Progress Notes (Signed)
Subjective:    Patient ID: Nancy Herrera, female    DOB: 1941/02/01, 75 y.o.   MRN: [id number]  HPI Here for anxiety   Is taking paxil 60 mg daily   Daughter fills the box and watches intermittently for what she is taking   Had put one xanax in the box per day to limit what she was taking  Last xanax dose was Monday am  Has not filled xanax today  Daughter was worried she was abusing it   Drug screen last time was neg for xanax (was out of it) and showed restoril   Confuses easily   Did not f/u due to husband going back into the hospital   Does not drink alcohol at all   Does have a friend who also takes anxiety - she takes medicine also -pt denies getting this from her   Per family - people are asking about her -due to easy confusion and speech issues  Barely eats  Down 6 lb with bmi of 22  Still smoking  1ppd at least   Walking and talking normally today    Patient Active Problem List   Diagnosis Date Noted  . Encounter for medication review 02/22/2016  . Frequent urination 02/22/2016  . Osteopenia 12/05/2015  . Fall at home 11/03/2015  . COPD exacerbation ([place]) 09/24/2015  . SOB (shortness of breath) on exertion 09/14/2015  . Routine general medical examination at a health care facility 08/27/2015  . Estrogen deficiency 08/27/2015  . UTI (urinary tract infection) 12/21/2014  . Encounter for Medicare annual wellness exam 07/17/2014  . Colon cancer screening 07/17/2014  . Pedal edema 03/09/2014  . Other screening mammogram 11/18/2012  . Immunization counseling 08/02/2012  . 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 SHOULDER PAIN, BILATERAL 12/06/2010  . ALLERGIC RHINITIS 08/23/2010  . Hyperglycemia 08/23/2010  . Hyperlipidemia 04/10/2007  . Generalized anxiety disorder 04/10/2007  . Smoker 04/10/2007  . DEPRESSION 04/10/2007  . Essential hypertension 04/10/2007  . GERD 04/10/2007  . DEGENERATIVE DISC DISEASE, LUMBOSACRAL SPINE 04/10/2007   Past  Medical History  Diagnosis Date  . Hypertension   . Cancer ([place])   . Degenerative disc disease, lumbar   . Hyperlipidemia   . GERD (gastroesophageal reflux disease)   . Hyperglycemia     controlled by diet  . Anxiety   . Depression   . Allergy     allergic rhinitis   Past Surgical History  Procedure Laterality Date  . Cataract extraction     Social History  Substance Use Topics  . Smoking status: Current Every Day Smoker -- 1.00 packs/day    Types: Cigarettes  . Smokeless tobacco: None     Comment: 1-2 packs per day  . Alcohol Use: No   Family History  Problem Relation Age of Onset  . Cancer Mother     breast cancer  . Cancer Sister     breast cancer & lung cancer  . Breast cancer Daughter    No Known Allergies Current Outpatient Prescriptions on File Prior to Visit  Medication Sig Dispense Refill  . acetaminophen (TYLENOL) 500 MG tablet OTC as directed     . albuterol (PROVENTIL HFA;VENTOLIN HFA) 108 (90 BASE) MCG/ACT inhaler Inhale 2 puffs into the lungs every 4 (four) hours as needed for wheezing or shortness of breath. 1 Inhaler 11  . ALPRAZolam (XANAX) 0.5 MG tablet TAKE 1 TABLET BY MOUTH ONCE DAILY AS NEEDED FOR ANXIETY 30 tablet 0  . aspirin  81 MG tablet Take 81 mg by mouth daily.      . furosemide (LASIX) 20 MG tablet TAKE 1 TABLET (20 MG TOTAL) BY MOUTH DAILY. 30 tablet 5  . glucose blood test strip To check glucose once daily and as needed for diabetes type 2     . glucose monitoring kit (FREESTYLE) monitoring kit To check glucose once daily and as needed for diabetes (250.0)     . Lancets MISC To check glucose once daily and as needed for diabetes type 2     . lisinopril (PRINIVIL,ZESTRIL) 10 MG tablet TAKE 1 TABLET BY MOUTH DAILY 90 tablet 1  . mupirocin ointment (BACTROBAN) 2 % Place 1 application into the nose 2 (two) times daily. Apply to the cut on your nose twice daily 15 g 0  . PARoxetine (PAXIL) 40 MG tablet Take 1 1/2 pills by mouth once daily 45  tablet 11  . promethazine (PHENERGAN) 25 MG tablet TAKE 1 TABLET EVERY 8 HOURS AS NEEDED FOR NAUSEA 30 tablet 2  . simvastatin (ZOCOR) 40 MG tablet TAKE 1 TABLET BY MOUTH EVERY DAY 90 tablet 3  . Umeclidinium-Vilanterol (ANORO ELLIPTA) 62.5-25 MCG/INH AEPB Inhale 1 puff into the lungs daily. 1 each 11   No current facility-administered medications on file prior to visit.     Review of Systems Review of Systems  Constitutional: Negative for fever, appetite change,  and pos for dec appetite and wt loss  Eyes: Negative for pain and visual disturbance.  Respiratory: Negative for cough and shortness of breath.   Cardiovascular: Negative for cp or palpitations    Gastrointestinal: Negative for nausea, diarrhea and constipation.  Genitourinary: Negative for urgency and frequency.  Skin: Negative for pallor or rash   Neurological: Negative for weakness, light-headedness, numbness and headaches.  Hematological: Negative for adenopathy. Does not bruise/bleed easily.  Psychiatric/Behavioral: Negative for dysphoric mood. The patient is extremely anxious , pos for mental status change on xanax that resolves off of it          Objective:   Physical Exam  Constitutional: She appears well-developed and well-nourished. No distress.  Slim (wt loss noted) and chronically ill appearing Quite anxious   HENT:  Head: Normocephalic and atraumatic.  Eyes: Conjunctivae and EOM are normal. Pupils are equal, round, and reactive to light. No scleral icterus.  Neck: Normal range of motion. Neck supple. No thyromegaly present.  Cardiovascular: Regular rhythm and normal heart sounds.   Pulmonary/Chest: Effort normal and breath sounds normal. No respiratory distress. She has no wheezes. She has no rales.  Diffusely distant bs   Musculoskeletal: She exhibits no edema.  Lymphadenopathy:    She has no cervical adenopathy.  Neurological: She is alert. She has normal reflexes. No cranial nerve deficit. She exhibits  normal muscle tone. Coordination normal.  Skin: Skin is warm and dry. No rash noted.  Psychiatric: Her behavior is normal. Her mood appears anxious. Her affect is not blunt and not labile. Her speech is not slurred. She is not withdrawn. Thought content is not paranoid. She exhibits a depressed mood. She expresses no homicidal and no suicidal ideation.  Struggles a bit with short term memory   Not confused or slurring however           Assessment & Plan:   Problem List Items Addressed This Visit      Cardiovascular and Mediastinum   Essential hypertension    Pt is quite anxious today - this may elevate bp  Re check at upcoming visit        Other   Generalized anxiety disorder - Primary    Long disc with pt and her daughter  anx is not well controlled with paxil  Concern re: side eff from xanax making her stumble/slur/loose balance and become confused/memory impaired   (today off of it she is anxious but quite mentally clear)  Will stop xanax - in fact -shredded her px given in feb  Will change paxil to effexor xr 75 mg daily -- Discussed expectations of SNRI medication including time to effectiveness and mechanism of action, also poss of side effects (early and late)- including mental fuzziness, weight or appetite change, nausea and poss of worse dep or anxiety (even suicidal thoughts)  Pt voiced understanding and will stop med and update if this occurs   Daughter is aware  Also concern that she may have obt some medication from a friend (restoril was in drug screen)-she denied this  Her daughter will remove all bottles from her house and administer her meds daily on her own so pt has no ability to over take anything  F/u planned-will do tox screen again at that visit and check bp  Also for the long term ref to psychiatry and also counselor -she agrees with this  Consider memory eval if no imp with better anxiety control  >25 minutes spent in face to face time with patient, >50%  spent in counselling or coordination of care        Relevant Orders   Ambulatory referral to Psychiatry   Ambulatory referral to Psychology   Smoker    Disc in detail risks of smoking and possible outcomes including copd, vascular/ heart disease, cancer , respiratory and sinus infections  Pt voices understanding She is not ready to quit   Understands that she cannot smoke in the house when her husband comes home from the hospital

## 2016-03-30 ENCOUNTER — Encounter: Payer: Self-pay | Admitting: Family Medicine

## 2016-03-30 NOTE — Assessment & Plan Note (Signed)
Long disc with pt and her daughter  anx is not well controlled with paxil  Concern re: side eff from xanax making her stumble/slur/loose balance and become confused/memory impaired   (today off of it she is anxious but quite mentally clear)  Will stop xanax - in fact -shredded her px given in feb  Will change paxil to effexor xr 75 mg daily -- Discussed expectations of SNRI medication including time to effectiveness and mechanism of action, also poss of side effects (early and late)- including mental fuzziness, weight or appetite change, nausea and poss of worse dep or anxiety (even suicidal thoughts)  Pt voiced understanding and will stop med and update if this occurs   Daughter is aware  Also concern that she may have obt some medication from a friend (restoril was in drug screen)-she denied this  Her daughter will remove all bottles from her house and administer her meds daily on her own so pt has no ability to over take anything  F/u planned-will do tox screen again at that visit and check bp  Also for the long term ref to psychiatry and also counselor -she agrees with this  Consider memory eval if no imp with better anxiety control  >25 minutes spent in face to face time with patient, >50% spent in counselling or coordination of care

## 2016-03-30 NOTE — Assessment & Plan Note (Signed)
Pt is quite anxious today - this may elevate bp  Re check at upcoming visit

## 2016-03-30 NOTE — Assessment & Plan Note (Signed)
Disc in detail risks of smoking and possible outcomes including copd, vascular/ heart disease, cancer , respiratory and sinus infections  Pt voices understanding She is not ready to quit   Understands that she cannot smoke in the house when her husband comes home from the hospital

## 2016-04-14 ENCOUNTER — Telehealth: Payer: Self-pay

## 2016-04-14 NOTE — Telephone Encounter (Signed)
Please ask her daughter if she is having any side effects - if not we can inc the dose to 150 daily  Please also ask about the status of her psychiatry and counseling referrals - does she have visits planned yet?

## 2016-04-14 NOTE — Telephone Encounter (Signed)
Please let her know that Dr Glori Bickers is out. I checked her past treatment and think it is likely she will need a higher dose. Have her take 2 of the effexor daily now  Will have Dr Glori Bickers review when she gets back and decide about new Rx for higher dose if appropriate

## 2016-04-14 NOTE — Telephone Encounter (Signed)
Spoke to daughter. They just wanted to make sure it was ok to increase the Effexor. I advised it was an appropriate dose. Dr Glori Bickers will decide whether or not she needs a different medication.

## 2016-04-14 NOTE — Telephone Encounter (Signed)
Spoke to patient. She will start taking two and will wait to see what Dr Glori Bickers has to say for the future.

## 2016-04-14 NOTE — Telephone Encounter (Signed)
Rodena Piety called pt daughter  Transfer to [employer]

## 2016-04-14 NOTE — Telephone Encounter (Signed)
Pt left v/m; pt was seen 03/29/16 and effexor xr 75 mg taking one daily was started. Pt request different med that is stronger. Pt does not think effexor is  working to help with pts nerves. Pt request cb.Dr Glori Bickers out of office.Please advise.

## 2016-04-17 NOTE — Telephone Encounter (Signed)
Called daughter and no answer so left voicemail asking pt's daughter how is she doing on 2 tabs of the effexor

## 2016-04-20 MED ORDER — VENLAFAXINE HCL ER 150 MG PO CP24: 150.0000 mg | ORAL_CAPSULE | Freq: Every day | ORAL | Status: AC

## 2016-04-20 NOTE — Telephone Encounter (Signed)
I sent in the effexor 150 Please schedule f/u with me (30 min office visit) to discuss nausea and other symptoms  I referred her to psychiatry and counseling last visit- are those appointments upcoming?

## 2016-04-20 NOTE — Telephone Encounter (Signed)
Left voicemail requesting pt's daughter to call office and schedule a f/u 80mn with Dr. TGlori Bickersand also let [place] when pt has psychiatry/counseling appts

## 2016-04-20 NOTE — Telephone Encounter (Signed)
Spoke with daughter she wanted to give it a few days on the new dose of effexor before she called back. Daughter said she can tell a difference with the increased dose of the effexor and she wants pt to stay on the '150mg'$  of effexor, please send in new Rx to pharmacy on file.  Daughter said that pt doesn't feel like the med is helping but daughter isn't sure if pt is trying to relate the feeling she had with the xanax (foggy feeling) to the feeling the effexor has on her. Daughter did have some concerns that pt may have gotten some meds from her friends because for a few days she wasn't acting like herself, she was confused, extra sleepy and her speech wasn't understandable but she did go back to her normal self. Daughter also concerned that pt may have some underlying medical conditions that we are just attributing her sxs to the medication/ anxiety. Lately pt has been more nauseous and daughter worried that some of her sxs are not related to her anxiety that she has a medical condition we are not aware of since pt has been off of the xanax and is still having some of the same issues/ very nauseous, daughter wanted to know what would be the next step to eval pt for a possible undiagnosed medical issue. (daughter said their whole family feels like this including pt's husband)

## 2016-04-27 NOTE — Telephone Encounter (Signed)
Daughter scheduled f/u appt with Dr. Glori Bickers. On 05/03/16  Daughter advised me that pt refused to see a psychiatrist but agreed to see a counselor. Pt has an appt scheduled with the counselor on 05/04/16    Daughter also wanted me to let you know that since pt's husband came back from the hospital he has noticed some issues with pt too (since he is with her all day). The think pt is getting meds from neighbor. They think that's how she had Remeron in her sxs on the last UDS. Daughter said they could tell a big difference in her attitude/anxiety on the increase dose of effexor but then her friend came over this weekend and now pt is acting sluggish, sleeping often and her speech was hard to understand again. Also pt's daughter-n-law came over one day and pt was begging her for medication too. daughter also found some medication that wasn't prescribed for her in her room and daughter took it, she said she looked it up and it was more nausea medication (but it wasn't her Rx). Daughter is requesting we do another UDS on her when she comes to her appt. Also she wants her to be eval for any medical issues going on, she is afraid she has underlying medical issues that we are just attributing to her anxiety

## 2016-04-27 NOTE — Telephone Encounter (Signed)
I will see her then-thanks for making the appointment  Please get urine for UDS on the way into the office and also keep enough to do a UA as well if possible  Thanks

## 2016-05-03 ENCOUNTER — Encounter: Payer: Self-pay | Admitting: Family Medicine

## 2016-05-03 ENCOUNTER — Ambulatory Visit (INDEPENDENT_AMBULATORY_CARE_PROVIDER_SITE_OTHER): Payer: Medicare Other | Admitting: Family Medicine

## 2016-05-03 ENCOUNTER — Other Ambulatory Visit: Payer: Self-pay | Admitting: Family Medicine

## 2016-05-03 VITALS — BP 136/76 | HR 97 | Temp 97.9°F | Wt 137.0 lb

## 2016-05-03 DIAGNOSIS — R413 Other amnesia: Secondary | ICD-10-CM

## 2016-05-03 DIAGNOSIS — R350 Frequency of micturition: Secondary | ICD-10-CM

## 2016-05-03 DIAGNOSIS — I10 Essential (primary) hypertension: Secondary | ICD-10-CM | POA: Diagnosis not present

## 2016-05-03 DIAGNOSIS — F411 Generalized anxiety disorder: Secondary | ICD-10-CM | POA: Diagnosis not present

## 2016-05-03 DIAGNOSIS — K219 Gastro-esophageal reflux disease without esophagitis: Secondary | ICD-10-CM

## 2016-05-03 DIAGNOSIS — F17200 Nicotine dependence, unspecified, uncomplicated: Secondary | ICD-10-CM

## 2016-05-03 DIAGNOSIS — Z720 Tobacco use: Secondary | ICD-10-CM

## 2016-05-03 MED ORDER — RANITIDINE HCL 150 MG PO TABS: 150.0000 mg | ORAL_TABLET | Freq: Two times a day (BID) | ORAL | Status: AC

## 2016-05-03 NOTE — Progress Notes (Signed)
Pre visit review using our clinic review tool, if applicable. No additional management support is needed unless otherwise documented below in the visit note. 

## 2016-05-03 NOTE — Patient Instructions (Addendum)
Try zantac 150 mg twice daily (am and pm) for nausea - if this helps you can cut back on nausea medicine  Try to avoid foods that give you heartburn  Urine drug screen today and urinalysis (if you can give a sample) Do not take any medicine you are not familiar with without letting us know Continue to cut down smoking  -with the eventual goal of quitting  Please increase your daytime activity as tolerated - stay busy , try to socialize some as well - that is really good for memory (consider playing card games) Continue current medicines   I'm glad you are doing better !   Keep me posted Follow up in about a month or so

## 2016-05-03 NOTE — Progress Notes (Signed)
Subjective:    Patient ID: Nancy Herrera, female    DOB: 29-Jun-1941, 75 y.o.   MRN: [id number]  HPI Here for mood and mental status change and also nausea and issues with medication   effexor is working much better 150 mg  BP Readings from Last 3 Encounters:  05/03/16 136/76  03/29/16 140/68  02/22/16 135/80     A lot less nervous  A few episodes of seeming sedated and slurring   Nausea worsens when she is more nervous and then it improves  Worse in am until medicine kicks in  Takes nausea medicine -once per day if any   (family says occ more)   Some heartburn -not that often and depends what she eats  Appetite is better -eating better and family brings over food   Smoking is the same -no changes  Smokes outside-not around her husband    Husband is doing much better  Getting back into a routine   Denies taking anyone else's medicine   Patient Active Problem List   Diagnosis Date Noted  . Memory loss 05/03/2016  . Encounter for medication review 02/22/2016  . Frequent urination 02/22/2016  . Osteopenia 12/05/2015  . Fall at home 11/03/2015  . COPD exacerbation ([place]) 09/24/2015  . SOB (shortness of breath) on exertion 09/14/2015  . Routine general medical examination at a health care facility 08/27/2015  . Estrogen deficiency 08/27/2015  . UTI (urinary tract infection) 12/21/2014  . Encounter for Medicare annual wellness exam 07/17/2014  . Colon cancer screening 07/17/2014  . Pedal edema 03/09/2014  . Other screening mammogram 11/18/2012  . Immunization counseling 08/02/2012  . 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 SHOULDER PAIN, BILATERAL 12/06/2010  . ALLERGIC RHINITIS 08/23/2010  . Hyperglycemia 08/23/2010  . Hyperlipidemia 04/10/2007  . Generalized anxiety disorder 04/10/2007  . Smoker 04/10/2007  . DEPRESSION 04/10/2007  . Essential hypertension 04/10/2007  . GERD 04/10/2007  . DEGENERATIVE DISC DISEASE, LUMBOSACRAL SPINE 04/10/2007   Past  Medical History  Diagnosis Date  . Hypertension   . Cancer ([place])   . Degenerative disc disease, lumbar   . Hyperlipidemia   . GERD (gastroesophageal reflux disease)   . Hyperglycemia     controlled by diet  . Anxiety   . Depression   . Allergy     allergic rhinitis   Past Surgical History  Procedure Laterality Date  . Cataract extraction     Social History  Substance Use Topics  . Smoking status: Current Every Day Smoker -- 1.00 packs/day    Types: Cigarettes  . Smokeless tobacco: None     Comment: 1-2 packs per day  . Alcohol Use: No   Family History  Problem Relation Age of Onset  . Cancer Mother     breast cancer  . Cancer Sister     breast cancer & lung cancer  . Breast cancer Daughter    No Known Allergies Current Outpatient Prescriptions on File Prior to Visit  Medication Sig Dispense Refill  . acetaminophen (TYLENOL) 500 MG tablet OTC as directed     . albuterol (PROVENTIL HFA;VENTOLIN HFA) 108 (90 BASE) MCG/ACT inhaler Inhale 2 puffs into the lungs every 4 (four) hours as needed for wheezing or shortness of breath. 1 Inhaler 11  . aspirin 81 MG tablet Take 81 mg by mouth daily.      [facility] glucose blood test strip To check glucose once daily and as needed for diabetes type 2     .  glucose monitoring kit (FREESTYLE) monitoring kit To check glucose once daily and as needed for diabetes (250.0)     . Lancets MISC To check glucose once daily and as needed for diabetes type 2     . lisinopril (PRINIVIL,ZESTRIL) 10 MG tablet TAKE 1 TABLET BY MOUTH DAILY 90 tablet 1  . mupirocin ointment (BACTROBAN) 2 % Place 1 application into the nose 2 (two) times daily. Apply to the cut on your nose twice daily 15 g 0  . promethazine (PHENERGAN) 25 MG tablet TAKE 1 TABLET EVERY 8 HOURS AS NEEDED FOR NAUSEA 30 tablet 2  . Umeclidinium-Vilanterol (ANORO ELLIPTA) 62.5-25 MCG/INH AEPB Inhale 1 puff into the lungs daily. 1 each 11  . venlafaxine XR (EFFEXOR XR) 150 MG 24 hr capsule Take 1  capsule (150 mg total) by mouth daily with breakfast. 30 capsule 11   No current facility-administered medications on file prior to visit.    Review of Systems Review of Systems  Constitutional: Negative for fever, appetite change,  and unexpected weight change.  Eyes: Negative for pain and visual disturbance.  Respiratory: Negative for cough and shortness of breath.   Cardiovascular: Negative for cp or palpitations    Gastrointestinal: Negative for nausea, diarrhea and constipation.  Genitourinary: Negative for urgency and pos for  frequency.  Skin: Negative for pallor or rash   Neurological: Negative for weakness, light-headedness, numbness and headaches.  Hematological: Negative for adenopathy. Does not bruise/bleed easily.  Psychiatric/Behavioral: Negative for dysphoric mood. Pos for anxiety that is much improved  Neg for further MS change or sedation       Objective:   Physical Exam  Constitutional: She appears well-developed and well-nourished. No distress.  Well appearing elderly female  HENT:  Head: Normocephalic and atraumatic.  Mouth/Throat: Oropharynx is clear and moist.  Eyes: Conjunctivae and EOM are normal. Pupils are equal, round, and reactive to light.  Neck: Normal range of motion. Neck supple. No JVD present. Carotid bruit is not present. No thyromegaly present.  Cardiovascular: Normal rate, regular rhythm, normal heart sounds and intact distal pulses.  Exam reveals no gallop.   Pulmonary/Chest: Effort normal and breath sounds normal. No respiratory distress. She has no wheezes. She has no rales.  No crackles  Diffusely distant bs  No wheezes today  Fair air exch  Abdominal: Soft. Bowel sounds are normal. She exhibits no distension, no abdominal bruit and no mass. There is tenderness.  Mild epigastric tenderness on deep palpation only  No rebound or guarding  Musculoskeletal: She exhibits no edema.  Lymphadenopathy:    She has no cervical adenopathy.    Neurological: She is alert. She has normal reflexes.  Skin: Skin is warm and dry. No rash noted.  Psychiatric: Her speech is normal and behavior is normal. Thought content normal. Her mood appears anxious. Her affect is not blunt, not labile and not inappropriate. She is not agitated. Thought content is not paranoid. She does not exhibit a depressed mood. She expresses no homicidal and no suicidal ideation.  Much improved affect today Much more mentally sharp and less anxious Answers questions appropriately  Cheerful for the most part Getting along well with family No paranoia  MMS score 27           Assessment & Plan:   Problem List Items Addressed This Visit      Cardiovascular and Mediastinum   Essential hypertension - Primary    bp in fair control at this time  BP Readings from  Last 1 Encounters:  05/03/16 136/76   No changes needed Disc lifstyle change with low sodium diet and exercise          Digestive   GERD    This may worsen her nausea in conj with anxiety  She also has heartburn Will try ranitidine 150 mg bid  F/u planned Given food avoidance handout for GERD       Relevant Medications   ranitidine (ZANTAC) 150 MG tablet     Other   Frequent urination    Pt could not give a urine sample today-will return tomorrow Enc better water intake      Relevant Orders   POCT Urinalysis Dipstick (Automated) (Completed)   Generalized anxiety disorder    Much improvement clinically with effexor xr 150 mg  Reviewed stressors/ coping techniques/symptoms/ support sources/ tx options and side effects in detail today  I notice a big difference in affect and sense of well being  Nl mental status  Not slurring or seeming sedated at all  Daughter notes things have been much better  She will continue to monitor and dose all of her medicine One incident ? If she used someone elses medicine-we disc this UDS ordered and we will follow F/u for counseling        Memory loss    Score of 27 on MMS - (of note- does not perform math well) Also rev clock draw- fairly good  Suspect this was worse when anxiety was severe and she was over taking xanax  Daughter states she is re assured today  Will continue to monitor       Smoker    Disc in detail risks of smoking and possible outcomes including copd, vascular/ heart disease, cancer , respiratory and sinus infections  Pt voices understanding Has cut to 1ppd since she has to smoke outside only  Now that anxiety has improved-will begin to make a cessation plan  F/u planned

## 2016-05-04 ENCOUNTER — Ambulatory Visit (INDEPENDENT_AMBULATORY_CARE_PROVIDER_SITE_OTHER): Payer: No Typology Code available for payment source | Admitting: Psychology

## 2016-05-04 DIAGNOSIS — F4323 Adjustment disorder with mixed anxiety and depressed mood: Secondary | ICD-10-CM | POA: Diagnosis not present

## 2016-05-04 LAB — POC URINALSYSI DIPSTICK (AUTOMATED)
Bilirubin, UA: NEGATIVE
Glucose, UA: NEGATIVE
KETONES UA: NEGATIVE
Leukocytes, UA: NEGATIVE
Nitrite, UA: NEGATIVE
Urobilinogen, UA: 0.2
pH, UA: 6

## 2016-05-04 NOTE — Assessment & Plan Note (Signed)
bp in fair control at this time  BP Readings from Last 1 Encounters:  05/03/16 136/76   No changes needed Disc lifstyle change with low sodium diet and exercise

## 2016-05-04 NOTE — Assessment & Plan Note (Signed)
Pt could not give a urine sample today-will return tomorrow Enc better water intake

## 2016-05-04 NOTE — Assessment & Plan Note (Signed)
Disc in detail risks of smoking and possible outcomes including copd, vascular/ heart disease, cancer , respiratory and sinus infections  Pt voices understanding Has cut to 1ppd since she has to smoke outside only  Now that anxiety has improved-will begin to make a cessation plan  F/u planned

## 2016-05-04 NOTE — Assessment & Plan Note (Signed)
This may worsen her nausea in conj with anxiety  She also has heartburn Will try ranitidine 150 mg bid  F/u planned Given food avoidance handout for GERD

## 2016-05-04 NOTE — Assessment & Plan Note (Signed)
Score of 27 on MMS - (of note- does not perform math well) Also rev clock draw- fairly good  Suspect this was worse when anxiety was severe and she was over taking xanax  Daughter states she is re assured today  Will continue to monitor

## 2016-05-04 NOTE — Assessment & Plan Note (Addendum)
Much improvement clinically with effexor xr 150 mg  Reviewed stressors/ coping techniques/symptoms/ support sources/ tx options and side effects in detail today  I notice a big difference in affect and sense of well being  Nl mental status  Not slurring or seeming sedated at all  Daughter notes things have been much better  She will continue to monitor and dose all of her medicine One incident ? If she used someone elses medicine-we disc this UDS ordered and we will follow F/u for counseling

## 2016-05-24 ENCOUNTER — Encounter: Payer: Self-pay | Admitting: Family Medicine

## 2016-05-28 ENCOUNTER — Other Ambulatory Visit: Payer: Self-pay | Admitting: Family Medicine

## 2016-06-01 ENCOUNTER — Other Ambulatory Visit: Payer: Self-pay

## 2016-06-01 ENCOUNTER — Ambulatory Visit: Payer: No Typology Code available for payment source | Admitting: Psychology

## 2016-06-01 ENCOUNTER — Other Ambulatory Visit: Payer: Self-pay | Admitting: Family Medicine

## 2016-06-01 NOTE — Telephone Encounter (Signed)
Left voicemail with daughter requesting her to call and give [place] an update on pt and asked how many pills of promethazine is she taking daily

## 2016-06-01 NOTE — Telephone Encounter (Signed)
Pt request refill promethazine to [facility]; [facility] and [facility] on 03/27/16,04/10/16 and 04/28/16.Please advise. Pt last seen 05/03/16.

## 2016-06-01 NOTE — Telephone Encounter (Signed)
Patient daughter returned Shapale's call.  Please call Rodena Piety back at [phone].

## 2016-06-01 NOTE — Telephone Encounter (Signed)
Please check in with her family (daughter) to see how much she has been needing and taking lately before I refill it  Thanks

## 2016-06-02 ENCOUNTER — Ambulatory Visit (INDEPENDENT_AMBULATORY_CARE_PROVIDER_SITE_OTHER): Payer: Medicare Other | Admitting: Internal Medicine

## 2016-06-02 ENCOUNTER — Encounter: Payer: Self-pay | Admitting: Internal Medicine

## 2016-06-02 VITALS — BP 110/70 | HR 99 | Temp 97.5°F | Wt 137.0 lb

## 2016-06-02 DIAGNOSIS — R110 Nausea: Secondary | ICD-10-CM | POA: Diagnosis not present

## 2016-06-02 MED ORDER — PROMETHAZINE HCL 25 MG PO TABS: ORAL_TABLET | ORAL | Status: DC

## 2016-06-02 NOTE — Progress Notes (Signed)
Subjective:    Patient ID: Nancy Herrera, female    DOB: Jun 12, 1941, 75 y.o.   MRN: [id number]  HPI Here due to nausea Here with husband  "Nerves, and I'm nauseated from it" Doesn't seem to be anything new since last visit Takes the phenergan every morning---this really helps--but she has run out No mental changes or sedation with this  No abdominal pain Appetite is poor with the nausea---good after the med Bowels are regular No vomiting  Current Outpatient Prescriptions on File Prior to Visit  Medication Sig Dispense Refill  . acetaminophen (TYLENOL) 500 MG tablet OTC as directed     . albuterol (PROVENTIL HFA;VENTOLIN HFA) 108 (90 BASE) MCG/ACT inhaler Inhale 2 puffs into the lungs every 4 (four) hours as needed for wheezing or shortness of breath. 1 Inhaler 11  . aspirin 81 MG tablet Take 81 mg by mouth daily.      Marland Kitchen glucose blood test strip To check glucose once daily and as needed for diabetes type 2     . glucose monitoring kit (FREESTYLE) monitoring kit To check glucose once daily and as needed for diabetes (250.0)     . Lancets MISC To check glucose once daily and as needed for diabetes type 2     . lisinopril (PRINIVIL,ZESTRIL) 10 MG tablet TAKE 1 TABLET BY MOUTH DAILY 90 tablet 1  . mupirocin ointment (BACTROBAN) 2 % Place 1 application into the nose 2 (two) times daily. Apply to the cut on your nose twice daily 15 g 0  . promethazine (PHENERGAN) 25 MG tablet TAKE 1 TABLET EVERY 8 HOURS AS NEEDED FOR NAUSEA 30 tablet 2  . ranitidine (ZANTAC) 150 MG tablet Take 1 tablet (150 mg total) by mouth 2 (two) times daily. 60 tablet 11  . simvastatin (ZOCOR) 40 MG tablet TAKE 1 TABLET BY MOUTH EVERY DAY 90 tablet 1  . Umeclidinium-Vilanterol (ANORO ELLIPTA) 62.5-25 MCG/INH AEPB Inhale 1 puff into the lungs daily. 1 each 11  . venlafaxine XR (EFFEXOR XR) 150 MG 24 hr capsule Take 1 capsule (150 mg total) by mouth daily with breakfast. 30 capsule 11   No current facility-administered  medications on file prior to visit.    No Known Allergies  Past Medical History  Diagnosis Date  . Hypertension   . Cancer ([place])   . Degenerative disc disease, lumbar   . Hyperlipidemia   . GERD (gastroesophageal reflux disease)   . Hyperglycemia     controlled by diet  . Anxiety   . Depression   . Allergy     allergic rhinitis    Past Surgical History  Procedure Laterality Date  . Cataract extraction      Family History  Problem Relation Age of Onset  . Cancer Mother     breast cancer  . Cancer Sister     breast cancer & lung cancer  . Breast cancer Daughter     Social History   Social History  . Marital Status: Married    Spouse Name: N/A  . Number of Children: N/A  . Years of Education: N/A   Occupational History  . Not on file.   Social History Main Topics  . Smoking status: Current Every Day Smoker -- 1.00 packs/day    Types: Cigarettes  . Smokeless tobacco: Not on file     Comment: 1-2 packs per day  . Alcohol Use: No  . Drug Use: No  . Sexual Activity: Not on file  Other Topics Concern  . Not on file   Social History Narrative   Review of Systems Weight down 7-10# from last year--relates to the nausea. Stable for ~2 months No cough or SOB No fever    Objective:   Physical Exam  Constitutional: She appears well-developed and well-nourished. No distress.  Pulmonary/Chest: Effort normal and breath sounds normal. No respiratory distress. She has no wheezes. She has no rales.  Abdominal: Soft. Bowel sounds are normal. She exhibits no distension. There is no tenderness. There is no rebound and no guarding.          Assessment & Plan:

## 2016-06-02 NOTE — Progress Notes (Signed)
Pre visit review using our clinic review tool, if applicable. No additional management support is needed unless otherwise documented below in the visit note. 

## 2016-06-02 NOTE — Assessment & Plan Note (Signed)
No worrisome features (other than the weight loss) Doesn't seem to have side effects with the promethazine and may have some anti anxiety effects Will refill  Chart to PCP

## 2016-06-05 ENCOUNTER — Telehealth: Payer: Self-pay | Admitting: Family Medicine

## 2016-06-05 NOTE — Telephone Encounter (Signed)
It may be a good idea to dose phenergan at night - she could take one in am and one in pm if not too sedating during the day  If Nancy Herrera will not allow her family to keep/ organize and administer her medications (as we discussed before)- then I will not be able to continue as her PCP (this puts her at risk and I am not comfortable with it) Let me know how it goes I will route to both [place] and Shapale since it is after hours

## 2016-06-05 NOTE — Telephone Encounter (Signed)
Daughter advised.

## 2016-06-05 NOTE — Telephone Encounter (Signed)
Daughter Susy Frizzle returned your call. Please call [phone] Thanks

## 2016-06-05 NOTE — Telephone Encounter (Signed)
Spoke with daughter who advise me that after seeing Dr. Silvio Pate on Friday pt did get a refill of her phenergan but refused to let daughter put it in the pill box, pt kept it and said she doesn't want to have to call her daughter every time she has nausea. Daughter isn't sure how many she is taking daily but she did advise her to only take one tab daily. Daughter said another problem is pt forgets to take GERD med and then she starts to have nausea. Daughter advise pt to leave pill box on the table so she will not forget to take meds but pt is refusing and puts it back in a cabinet and then sometimes forgets it. Daughter said pt's anxiety is worse in morning so she takes the phenergan in the morning but she gets really sleepy during the day and then she is up a lot at night. Daughter wants to know if they should be worried she isn't sleeping a lot at night and if they should start giving her the phenergan at night to help with sleep

## 2016-06-10 ENCOUNTER — Other Ambulatory Visit: Payer: Self-pay | Admitting: Family Medicine

## 2016-06-12 NOTE — Telephone Encounter (Signed)
Not on med list, please advise 

## 2016-06-12 NOTE — Telephone Encounter (Signed)
She is no longer on this-please deny

## 2016-07-24 ENCOUNTER — Telehealth: Payer: Self-pay

## 2016-07-24 NOTE — Telephone Encounter (Signed)
Pt left v/m requesting med for her nerves; pts husband is in hospital on breathing machine. Pt request cb. [facility] [address]

## 2016-07-24 NOTE — Telephone Encounter (Signed)
Rodena Piety (DPR signed) and is concerned about pt being on other meds for anxiety. Rodena Piety thinks pt is doing better on the effexor XL. Rodena Piety will talk with pt about appt and cb to schedule 30 min appt.

## 2016-07-24 NOTE — Telephone Encounter (Signed)
Make appt when you can with a family member - we can run through the options again if you do not think what you are taking is working - I know stress if very high right now  30 min  Visit please

## 2016-08-16 ENCOUNTER — Other Ambulatory Visit: Payer: Self-pay | Admitting: Family Medicine

## 2016-08-16 ENCOUNTER — Other Ambulatory Visit: Payer: Self-pay | Admitting: Internal Medicine

## 2016-08-16 NOTE — Telephone Encounter (Signed)
Rodena Piety returned your call Best number [phone]

## 2016-08-16 NOTE — Telephone Encounter (Signed)
Spoke with daughter and they do not want to back on xanax at all. Pt is stable but given spouse passed away she is depressed but she does have her children with her at all times right now, they are making sure she eats and takes her meds as prescribed. Pt does need a refill of phenergan due to a little more nausea from anxiety and daughter Susy Frizzle wanted to know if there is an higher dose of the effexor she can take. Daughter wants to know if that is an option instead of giving her an anxiety med like the xanax

## 2016-08-16 NOTE — Telephone Encounter (Signed)
Left voicemail requesting pt's daughter [employer] to call the office back

## 2016-08-16 NOTE — Telephone Encounter (Signed)
Please let her daughter know that we think we rec a call from Ms Sweetman-but she did not id herself asking for xanax - as she did just loose her husband  (the call was difficult to decipher) Please ask how she (and all of them) are doing.?   We were not going to give any more xanax in the past due to issues with it we have discussed.  If she feels this is an exception and she needs a small quantity to get through this rough time-please let me know  If she does not think it was her mother who called -let me know as well  Anything I can do for them at all= please alert me.   Thanks

## 2016-08-16 NOTE — Telephone Encounter (Signed)
I wish it was-but this is the top dose I can give of the effexor at her age.  Please call in 3 refills on the phenergan. I know this is a very difficult time and we can refer her for counseling as well if needed - just keep me posted.  Thanks for taking good care of her and making sure she eats/ etc.  Our thoughts are with your family.

## 2016-08-17 ENCOUNTER — Other Ambulatory Visit: Payer: Self-pay | Admitting: Family Medicine

## 2016-08-17 MED ORDER — PROMETHAZINE HCL 25 MG PO TABS: ORAL_TABLET | ORAL | 2 refills | Status: DC

## 2016-08-17 NOTE — Telephone Encounter (Signed)
Left voicemail letting pt's daughter know Dr. Marliss Coots comments. Rx sent to pharmacy and daughter advise to call us back if pt wants to proceed with counseling referral

## 2016-08-30 ENCOUNTER — Ambulatory Visit (INDEPENDENT_AMBULATORY_CARE_PROVIDER_SITE_OTHER): Payer: Medicare Other | Admitting: Family Medicine

## 2016-08-30 ENCOUNTER — Encounter: Payer: Self-pay | Admitting: Family Medicine

## 2016-08-30 VITALS — BP 108/62 | HR 109 | Temp 97.8°F | Ht 66.0 in | Wt 132.8 lb

## 2016-08-30 DIAGNOSIS — R300 Dysuria: Secondary | ICD-10-CM

## 2016-08-30 DIAGNOSIS — I10 Essential (primary) hypertension: Secondary | ICD-10-CM | POA: Diagnosis not present

## 2016-08-30 DIAGNOSIS — F4320 Adjustment disorder, unspecified: Secondary | ICD-10-CM

## 2016-08-30 DIAGNOSIS — N3000 Acute cystitis without hematuria: Secondary | ICD-10-CM

## 2016-08-30 DIAGNOSIS — F4321 Adjustment disorder with depressed mood: Secondary | ICD-10-CM | POA: Diagnosis not present

## 2016-08-30 LAB — POC URINALSYSI DIPSTICK (AUTOMATED)
BILIRUBIN UA: NEGATIVE
GLUCOSE UA: NEGATIVE
Nitrite, UA: NEGATIVE
SPEC GRAV UA: 1.01
Urobilinogen, UA: 0.2
pH, UA: 6

## 2016-08-30 MED ORDER — SULFAMETHOXAZOLE-TRIMETHOPRIM 800-160 MG PO TABS: 1.0000 | ORAL_TABLET | Freq: Two times a day (BID) | ORAL | 0 refills | Status: DC

## 2016-08-30 NOTE — Progress Notes (Signed)
Pre visit review using our clinic review tool, if applicable. No additional management support is needed unless otherwise documented below in the visit note. 

## 2016-08-30 NOTE — Assessment & Plan Note (Signed)
This initially worsened her GAD symptoms- but is doing better now  Recent loss of husband Excellent family support  Counseling offered in future if she wants it Reviewed stressors/ coping techniques/symptoms/ support sources/ tx options and side effects in detail today Still avoiding benzos  Supportive daughter here with her today

## 2016-08-30 NOTE — Assessment & Plan Note (Signed)
bp in fair control at this time  BP Readings from Last 1 Encounters:  08/30/16 108/62   No changes needed Disc lifstyle change with low sodium diet and exercise

## 2016-08-30 NOTE — Assessment & Plan Note (Signed)
Mildly pos ua with significant symptoms Enc water intake  tx with bactrim ds  Alert of any symptoms of vaginal yeast infx cx pending Update if not starting to improve in a week or if worsening

## 2016-08-30 NOTE — Progress Notes (Signed)
Subjective:    Patient ID: Nancy Herrera, female    DOB: 1941-06-14, 75 y.o.   MRN: [id number]  HPI Here for urinary symptoms -since Saturday  She sips on water   Has pain in the bladder area  Some pain on urination yesterday  No blood in urine  Some urgency  Gets to the bathroom and not much volume    UA- sm leuk and mod blood  Results for orders placed or performed in visit on 08/30/16  POCT Urinalysis Dipstick (Automated)  Result Value Ref Range   Color, UA Yellow    Clarity, UA Hazy    Glucose, UA Negative    Bilirubin, UA Negative    Ketones, UA Trace    Spec Grav, UA 1.010    Blood, UA 80 Ery/uL    pH, UA 6.0    Protein, UA Trace    Urobilinogen, UA 0.2    Nitrite, UA Negative    Leukocytes, UA small (1+) (A) Negative     Lost husband recently to heart problems and sepsis   Wt Readings from Last 3 Encounters:  08/30/16 132 lb 12 oz (60.2 kg)  06/02/16 137 lb (62.1 kg)  05/03/16 137 lb (62.1 kg)   She thinks she is doing ok  Initially anx worsened-now calming down a bit  Continues effexor 150 -helpful  Avoid benzos-past bad exp with them  Family is excellent support-living with her kids now  Denies need for extra grief counseling Coping mechanisms are good except for smoking -will work on that later   BP is stable BP Readings from Last 3 Encounters:  08/30/16 108/62  06/02/16 110/70  05/03/16 136/76   Appetite is also coming back   Patient Active Problem List   Diagnosis Date Noted  . Grief reaction 08/30/2016  . Nausea without vomiting 06/02/2016  . Memory loss 05/03/2016  . Encounter for medication review 02/22/2016  . Frequent urination 02/22/2016  . Osteopenia 12/05/2015  . Fall at home 11/03/2015  . COPD exacerbation ([place]) 09/24/2015  . SOB (shortness of breath) on exertion 09/14/2015  . Routine general medical examination at a health care facility 08/27/2015  . Estrogen deficiency 08/27/2015  . UTI (urinary tract infection) 12/21/2014    . Encounter for Medicare annual wellness exam 07/17/2014  . Colon cancer screening 07/17/2014  . Pedal edema 03/09/2014  . Other screening mammogram 11/18/2012  . Immunization counseling 08/02/2012  . 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 SHOULDER PAIN, BILATERAL 12/06/2010  . ALLERGIC RHINITIS 08/23/2010  . Hyperglycemia 08/23/2010  . Hyperlipidemia 04/10/2007  . Generalized anxiety disorder 04/10/2007  . Smoker 04/10/2007  . DEPRESSION 04/10/2007  . Essential hypertension 04/10/2007  . GERD 04/10/2007  . DEGENERATIVE DISC DISEASE, LUMBOSACRAL SPINE 04/10/2007   Past Medical History:  Diagnosis Date  . Allergy    allergic rhinitis  . Anxiety   . Cancer ([place])   . Degenerative disc disease, lumbar   . Depression   . GERD (gastroesophageal reflux disease)   . Hyperglycemia    controlled by diet  . Hyperlipidemia   . Hypertension    Past Surgical History:  Procedure Laterality Date  . CATARACT EXTRACTION     Social History  Substance Use Topics  . Smoking status: Current Every Day Smoker    Packs/day: 1.00    Types: Cigarettes  . Smokeless tobacco: Never Used     Comment: 1-2 packs per day  . Alcohol use No   Family History  Problem  Relation Age of Onset  . Cancer Mother     breast cancer  . Cancer Sister     breast cancer & lung cancer  . Breast cancer Daughter    No Known Allergies Current Outpatient Prescriptions on File Prior to Visit  Medication Sig Dispense Refill  . acetaminophen (TYLENOL) 500 MG tablet OTC as directed     . albuterol (PROVENTIL HFA;VENTOLIN HFA) 108 (90 BASE) MCG/ACT inhaler Inhale 2 puffs into the lungs every 4 (four) hours as needed for wheezing or shortness of breath. 1 Inhaler 11  . aspirin 81 MG tablet Take 81 mg by mouth daily.      Marland Kitchen glucose blood test strip To check glucose once daily and as needed for diabetes type 2     . glucose monitoring kit (FREESTYLE) monitoring kit To check glucose once daily and as needed for  diabetes (250.0)     . Lancets MISC To check glucose once daily and as needed for diabetes type 2     . lisinopril (PRINIVIL,ZESTRIL) 10 MG tablet TAKE 1 TABLET BY MOUTH DAILY 90 tablet 1  . mupirocin ointment (BACTROBAN) 2 % Place 1 application into the nose 2 (two) times daily. Apply to the cut on your nose twice daily 15 g 0  . promethazine (PHENERGAN) 25 MG tablet Take tab daily as needed 30 tablet 2  . ranitidine (ZANTAC) 150 MG tablet Take 1 tablet (150 mg total) by mouth 2 (two) times daily. 60 tablet 11  . simvastatin (ZOCOR) 40 MG tablet TAKE 1 TABLET BY MOUTH EVERY DAY 90 tablet 1  . Umeclidinium-Vilanterol (ANORO ELLIPTA) 62.5-25 MCG/INH AEPB Inhale 1 puff into the lungs daily. 1 each 11  . venlafaxine XR (EFFEXOR XR) 150 MG 24 hr capsule Take 1 capsule (150 mg total) by mouth daily with breakfast. 30 capsule 11   No current facility-administered medications on file prior to visit.     Review of Systems Review of Systems  Constitutional: Negative for fever, appetite change, fatigue and unexpected weight change.  Eyes: Negative for pain and visual disturbance.  Respiratory: Negative for cough and shortness of breath.   Cardiovascular: Negative for cp or palpitations    Gastrointestinal: Negative for nausea, diarrhea and constipation.  Genitourinary: pos for urgency and frequency. neg for hematuria or flank pain  Skin: Negative for pallor or rash   Neurological: Negative for weakness, light-headedness, numbness and headaches.  Hematological: Negative for adenopathy. Does not bruise/bleed easily.  Psychiatric/Behavioral: pos for grief with intermittent tearfulness and anxiety , neg for SI or hopelessness          Objective:   Physical Exam  Constitutional: She appears well-developed and well-nourished. No distress.  Well appearing elderly female  HENT:  Head: Normocephalic and atraumatic.  Eyes: Conjunctivae and EOM are normal. Pupils are equal, round, and reactive to light.    Neck: Normal range of motion. Neck supple.  Cardiovascular: Normal rate, regular rhythm and normal heart sounds.   Pulmonary/Chest: Effort normal and breath sounds normal.  Diffusely distant bs   Abdominal: Soft. Bowel sounds are normal. She exhibits no distension. There is tenderness. There is no rebound.  No cva tenderness  Mild suprapubic tenderness  Musculoskeletal: She exhibits no edema.  Lymphadenopathy:    She has no cervical adenopathy.  Neurological: She is alert.  Skin: No rash noted.  Psychiatric: Her speech is normal and behavior is normal. Thought content normal. Her mood appears anxious. Her affect is not blunt, not labile  and not inappropriate. She is not agitated and not aggressive. Thought content is not paranoid. She expresses no homicidal and no suicidal ideation.  More calm than expected today  Supportive daughter present  Tearful when disc her recent loss  Sense of humor intact  Attentive           Assessment & Plan:   Problem List Items Addressed This Visit      Cardiovascular and Mediastinum   Essential hypertension    bp in fair control at this time  BP Readings from Last 1 Encounters:  08/30/16 108/62   No changes needed Disc lifstyle change with low sodium diet and exercise          Genitourinary   UTI (urinary tract infection) - Primary    Mildly pos ua with significant symptoms Enc water intake  tx with bactrim ds  Alert of any symptoms of vaginal yeast infx cx pending Update if not starting to improve in a week or if worsening        Relevant Medications   sulfamethoxazole-trimethoprim (BACTRIM DS,SEPTRA DS) 800-160 MG tablet   Other Relevant Orders   Urine culture     Other   Grief reaction    This initially worsened her GAD symptoms- but is doing better now  Recent loss of husband Excellent family support  Counseling offered in future if she wants it Reviewed stressors/ coping techniques/symptoms/ support sources/ tx  options and side effects in detail today Still avoiding benzos  Supportive daughter here with her today       Other Visit Diagnoses    Dysuria       Relevant Orders   POCT Urinalysis Dipstick (Automated) (Completed)

## 2016-08-30 NOTE — Patient Instructions (Signed)
Take care of yourself  Drink lots of water  Take the bactrim (antibiotic) for uti  We will culture your urine and update you with a result If you get symptoms of a vaginal yeast infection let us know  If you are ever interested in grief counseling let ue know

## 2016-09-01 ENCOUNTER — Telehealth: Payer: Self-pay | Admitting: Family Medicine

## 2016-09-01 LAB — URINE CULTURE

## 2016-09-01 MED ORDER — CIPROFLOXACIN HCL 250 MG PO TABS: 250.0000 mg | ORAL_TABLET | Freq: Two times a day (BID) | ORAL | 0 refills | Status: DC

## 2016-09-01 NOTE — Telephone Encounter (Signed)
uti is not sensitive to the sulfa drug we gave you-so we need to change it to cipro  [facility] Take cipro 250 mg twice daily for 5 days Stop the prior antibiotic (septra) Let me know if you do not feel better after this Keep drinking water!

## 2016-09-01 NOTE — Telephone Encounter (Signed)
Patient advised.

## 2016-09-04 NOTE — Telephone Encounter (Signed)
Rodena Piety DPR signed called to find out what pt was told on 09/01/16. Rodena Piety notified as instructed and voiced understanding.

## 2016-09-20 ENCOUNTER — Ambulatory Visit: Payer: Self-pay | Admitting: Family Medicine

## 2016-09-22 ENCOUNTER — Encounter: Payer: Self-pay | Admitting: Family Medicine

## 2016-09-22 ENCOUNTER — Ambulatory Visit (INDEPENDENT_AMBULATORY_CARE_PROVIDER_SITE_OTHER): Payer: Medicare Other | Admitting: Family Medicine

## 2016-09-22 VITALS — BP 128/59 | HR 97 | Temp 98.7°F | Ht 66.0 in | Wt 133.0 lb

## 2016-09-22 DIAGNOSIS — F17200 Nicotine dependence, unspecified, uncomplicated: Secondary | ICD-10-CM

## 2016-09-22 DIAGNOSIS — J439 Emphysema, unspecified: Secondary | ICD-10-CM | POA: Insufficient documentation

## 2016-09-22 DIAGNOSIS — J441 Chronic obstructive pulmonary disease with (acute) exacerbation: Secondary | ICD-10-CM | POA: Diagnosis not present

## 2016-09-22 DIAGNOSIS — Z720 Tobacco use: Secondary | ICD-10-CM

## 2016-09-22 MED ORDER — UMECLIDINIUM-VILANTEROL 62.5-25 MCG/INH IN AEPB: 1.0000 | INHALATION_SPRAY | Freq: Every day | RESPIRATORY_TRACT | 11 refills | Status: AC

## 2016-09-22 MED ORDER — ALBUTEROL SULFATE HFA 108 (90 BASE) MCG/ACT IN AERS: 2.0000 | INHALATION_SPRAY | RESPIRATORY_TRACT | 11 refills | Status: AC | PRN

## 2016-09-22 MED ORDER — BENZONATATE 200 MG PO CAPS: 200.0000 mg | ORAL_CAPSULE | Freq: Two times a day (BID) | ORAL | 0 refills | Status: DC | PRN

## 2016-09-22 MED ORDER — AZITHROMYCIN 250 MG PO TABS: ORAL_TABLET | ORAL | 0 refills | Status: DC

## 2016-09-22 NOTE — Progress Notes (Signed)
   Subjective:    Patient ID: Nancy Herrera, female    DOB: Aug 15, 1941, 75 y.o.   MRN: [id number]  Cough  This is a new problem. The current episode started in the past 7 days. The problem has been gradually worsening. The problem occurs constantly. The cough is productive of purulent sputum ( change in cough, now more productive). Associated symptoms include nasal congestion and postnasal drip. Pertinent negatives include no chest pain, chills, ear congestion, ear pain, fever, headaches, myalgias, sore throat, shortness of breath or wheezing. Associated symptoms comments: Sinus pressure  no sneeze, no itchy eyes. The symptoms are aggravated by lying down ( cough keeping her up at night). Risk factors for lung disease include smoking/tobacco exposure. She has tried a beta-agonist inhaler and OTC cough suppressant (Does not have inhaler.   No controller) for the symptoms. Her past medical history is significant for COPD.     SMOKER 1 ppd  Review of Systems  Constitutional: Negative for chills and fever.  HENT: Positive for postnasal drip. Negative for ear pain and sore throat.   Respiratory: Positive for cough. Negative for shortness of breath and wheezing.   Cardiovascular: Negative for chest pain.  Musculoskeletal: Negative for myalgias.  Neurological: Negative for headaches.       Objective:   Physical Exam  Constitutional: Vital signs are normal. She appears well-developed and well-nourished. She is cooperative.  Non-toxic appearance. She does not appear ill. No distress.  HENT:  Head: Normocephalic.  Right Ear: Hearing, tympanic membrane, external ear and ear canal normal. Tympanic membrane is not erythematous, not retracted and not bulging.  Left Ear: Hearing, tympanic membrane, external ear and ear canal normal. Tympanic membrane is not erythematous, not retracted and not bulging.  Nose: Mucosal edema and rhinorrhea present. Right sinus exhibits no maxillary sinus tenderness and no  frontal sinus tenderness. Left sinus exhibits no maxillary sinus tenderness and no frontal sinus tenderness.  Mouth/Throat: Uvula is midline, oropharynx is clear and moist and mucous membranes are normal.  Eyes: Conjunctivae, EOM and lids are normal. Pupils are equal, round, and reactive to light. Lids are everted and swept, no foreign bodies found.  Neck: Trachea normal and normal range of motion. Neck supple. Carotid bruit is not present. No thyroid mass and no thyromegaly present.  Cardiovascular: Normal rate, regular rhythm, S1 normal, S2 normal, normal heart sounds, intact distal pulses and normal pulses.  Exam reveals no gallop and no friction rub.   No murmur heard. Pulmonary/Chest: Effort normal. No tachypnea. No respiratory distress. She has decreased breath sounds. She has no wheezes. She has no rhonchi. She has no rales.  Neurological: She is alert.  Skin: Skin is warm, dry and intact. No rash noted.  Psychiatric: Her speech is normal and behavior is normal. Judgment normal. Her mood appears not anxious. Cognition and memory are normal. She does not exhibit a depressed mood.          Assessment & Plan:

## 2016-09-22 NOTE — Assessment & Plan Note (Signed)
Reviewed Pulm note from last year.  She did not like Dr. Liana Crocker.. Refuses to see again.  She is hypoxic but  Is chronically so with 02 sat 85-88% in last year.  Will start trial of aAnoro as he recommended.  Will hve pt follow up with PCP in 1 week to disucss PFT and or referral to a different pulmonologist for further treatment.

## 2016-09-22 NOTE — Progress Notes (Signed)
Pre visit review using our clinic review tool, if applicable. No additional management support is needed unless otherwise documented below in the visit note. 

## 2016-09-22 NOTE — Assessment & Plan Note (Signed)
Change in sputum.. Treat with antibiotics.  No clear indication for CXR.  No current  Wheeze, per pt breathing at baseline.  She is hypoxic but  Is chronically so with O2 sat 85-88% in last year.  Benzonatate for cough prn.  Albuterol prn wheeze and SOB.

## 2016-09-22 NOTE — Patient Instructions (Addendum)
Quit smoking.  Start and complete antibiotics.  Benzonatate for cough.  Start Anoro as pulmonologist intended last year.  Follow up with [facility]CP [facility] in 7-10 days to discuss PFTs or referral back to pulmonary ( different one as did not like last one).

## 2016-09-22 NOTE — Assessment & Plan Note (Signed)
Pt will try to cut back on smoking.

## 2016-09-26 ENCOUNTER — Ambulatory Visit: Payer: Self-pay | Admitting: Family Medicine

## 2016-10-02 ENCOUNTER — Ambulatory Visit: Payer: Self-pay | Admitting: Family Medicine

## 2016-10-13 ENCOUNTER — Ambulatory Visit: Payer: Medicare Other | Admitting: Family Medicine

## 2016-10-19 ENCOUNTER — Other Ambulatory Visit: Payer: Self-pay

## 2016-10-19 MED ORDER — PROMETHAZINE HCL 25 MG PO TABS: ORAL_TABLET | ORAL | 2 refills | Status: DC

## 2016-10-19 NOTE — Telephone Encounter (Signed)
Pt left v/m requesting refill promethazine to [facility]. Last refilled # 30 x 2 on 08/17/16. I spoke with pt and she has a few pills left. Pt said she does not take them every day. I spoke with [place] and pt got # 30 on 08/17/16; # 30 on 09/06/16 and # 30 on 09/29/16. [facility] not [facility] got [facility] early since when ran thru ins allowed the refill.Pt seen 06/02/16 for nausea.Please advise.

## 2016-10-19 NOTE — Telephone Encounter (Signed)
done

## 2016-10-19 NOTE — Telephone Encounter (Signed)
Please refill times 3 -thanks 

## 2016-11-13 ENCOUNTER — Other Ambulatory Visit: Payer: Self-pay | Admitting: Family Medicine

## 2016-11-13 NOTE — Telephone Encounter (Signed)
No recent f/u or CPE and no recent lipid labs (over a year), pt has had a few recent acute appts., please advise

## 2016-11-13 NOTE — Telephone Encounter (Signed)
Please schedule 30 min f/u or PE in spring and refill until then  If PE-also schedule with Katha Cabal  Thanks

## 2016-11-15 NOTE — Telephone Encounter (Signed)
F/u scheduled and med refilled

## 2016-11-16 ENCOUNTER — Other Ambulatory Visit: Payer: Self-pay | Admitting: Family Medicine

## 2016-11-29 ENCOUNTER — Telehealth: Payer: Self-pay

## 2016-11-29 NOTE — Telephone Encounter (Signed)
She needs to follow up - tell her we will not refill her phenergan until she follows up -please schedule a 45 minute appt if poss so I can also do a mini mental status exam at that time ( and have her daughter bring her so she cannot get out of it ) This is worrisome for dementia or med reaction or mood disorder

## 2016-11-29 NOTE — Telephone Encounter (Signed)
Daughter notified of Dr. Marliss Coots comments/recommendations and verbalized understanding. Daughter declined to schedule an appt right now due to her being sick and the holidays but she will call back as soon as she can to schedule f/u but I did advise daughter that if we get a refill request for the phenergan that I will call pt and advise her a f/u is due before we can fill meds again

## 2016-11-29 NOTE — Telephone Encounter (Signed)
Susy Frizzle request cb from [facility]; advised Rodena Piety (DPR signed) that Rexene Agent is with pts but Rodena Piety said she preferred to speak with [place].

## 2016-11-29 NOTE — Telephone Encounter (Signed)
Spoke to daughter Rodena Piety and she is really concerned about patient. Pt is back home but pt's son is handling pt's meds and pt is starting to refuse to take all of her meds some days except her phenergan. Pt's son will bring a few phenergan tabs over to pt's house when she calls him but pt kept calling him nonstop saying she needs the phenergan med so pt's son brought her over 7 tablets recently and the very next day she called him saying she needs more meds because she took all 7 tabs already.   Daughter said she has scheduled a few f/u appts with you but pt will call and cancel them, daughter said pt refuses to come in for an appt as long as she has refills of phenergan on file with the pharmacy.  Daughter is worried that pt may be developing early alzheimer because she has good days but then she has been having bad days where it seems like pt isn't understanding anything they are telling her and those are the days that she isn't taking her meds too. Daughter is wanting pt to be tested for dementia/alzheimer because she is very worried about her mom,   Daughter would like to discuss this with Dr. Glori Bickers if she has time to call to discuss her concerns and what the next step for pt should be

## 2016-11-30 ENCOUNTER — Telehealth: Payer: Self-pay

## 2016-11-30 NOTE — Telephone Encounter (Signed)
Pt left v/m requesting 30 day refill of nausea med (Promethazine?); pt should have available refills but pt has been taking 2 a day due to losing her husband.pt request cb. [place]

## 2016-11-30 NOTE — Telephone Encounter (Signed)
See previous phone note- please call her daughter and let her know that Ms Bedgood called for a refill - I had requested a f/u

## 2016-11-30 NOTE — Telephone Encounter (Signed)
Pt wanted to be seen ASAP due to her being out of medication, and requested an appt tomorrow. appt scheduled at 3:15 (30 min appt) but I told pt to be here at 3:00pm because Dr. Glori Bickers had a 3:00 block so she can have 58mn if needed, I also called pt's daughter and advise her of pt's appt time at 3:00pm tomorrow

## 2016-12-01 ENCOUNTER — Ambulatory Visit: Payer: Self-pay | Admitting: Family Medicine

## 2016-12-01 ENCOUNTER — Other Ambulatory Visit: Payer: Self-pay | Admitting: *Deleted

## 2016-12-01 ENCOUNTER — Telehealth: Payer: Self-pay | Admitting: Family Medicine

## 2016-12-01 MED ORDER — PROMETHAZINE HCL 25 MG PO TABS: ORAL_TABLET | ORAL | 0 refills | Status: DC

## 2016-12-01 NOTE — Telephone Encounter (Signed)
Previously spoke to pts daughter and rescheduled appt. Medication refilled for #5 per Dr Glori Bickers and daughter to last until upcoming appt

## 2016-12-01 NOTE — Telephone Encounter (Signed)
Please call patient's daughter,Anita,about today's appointment.

## 2016-12-02 ENCOUNTER — Other Ambulatory Visit: Payer: Self-pay | Admitting: Family Medicine

## 2016-12-05 ENCOUNTER — Ambulatory Visit: Payer: Self-pay | Admitting: Family Medicine

## 2016-12-06 ENCOUNTER — Ambulatory Visit: Payer: Self-pay | Admitting: Family Medicine

## 2016-12-08 ENCOUNTER — Telehealth: Payer: Self-pay | Admitting: Family Medicine

## 2016-12-08 NOTE — Telephone Encounter (Signed)
Unable to reach [place] or [facility] by phone.

## 2016-12-08 NOTE — Telephone Encounter (Signed)
Unable to reach pt's daughter

## 2016-12-08 NOTE — Telephone Encounter (Signed)
Patient Name: Nancy Herrera DOB: Mar 11, 1941 Initial Comment Caller states mother is confused and rocking back and forth. Nurse Assessment Nurse: Ronnald Ramp, RN, Miranda Date/Time (Eastern Time): 12/08/2016 9:04:39 AM Confirm and document reason for call. If symptomatic, describe symptoms. ---Caller states his mother has not slept the last 2 days. She is hallucinating and out of it. She has been slowly developing symptoms and has an appt with PCP on Monday to discuss concerns. She has been sick and wheezing, SOB, mild cough, and weakness. Gave dose of Albuterol this morning which helped with breathing issues Does the patient have any new or worsening symptoms? ---Yes Will a triage be completed? ---Yes Related visit to physician within the last 2 weeks? ---No Does the PT have any chronic conditions? (i.e. diabetes, asthma, etc.) ---Yes List chronic conditions. ---COPD, HTN, GERD, Depression, High Cholesterol Is this a behavioral health or substance abuse call? ---No Guidelines Guideline Title Affirmed Question Affirmed Notes COPD Oxygen Monitoring and Hypoxia [1] MILD difficulty breathing (e.g., minimal/no SOB at rest, SOB with walking) AND [2] worse than normal Confusion - Delirium Patient sounds very sick or weak to the triager Final Disposition User Go to ED Now (or PCP triage) Ronnald Ramp, RN, Miranda Referrals GO TO FACILITY UNDECIDED Disagree/Comply: Comply Disagree/Comply: Comply

## 2016-12-08 NOTE — Telephone Encounter (Signed)
Sounds like no one can reach her - keep appt for Monday (if you do reach her)-she may need sat clinic or UC for cough and congestion  I think her daughter is contact

## 2016-12-11 ENCOUNTER — Ambulatory Visit (INDEPENDENT_AMBULATORY_CARE_PROVIDER_SITE_OTHER)
Admission: RE | Admit: 2016-12-11 | Discharge: 2016-12-11 | Disposition: A | Discharge status: Home / Self Care | Payer: Medicare Other | Source: Ambulatory Visit | Attending: Family Medicine | Admitting: Family Medicine

## 2016-12-11 ENCOUNTER — Ambulatory Visit (INDEPENDENT_AMBULATORY_CARE_PROVIDER_SITE_OTHER): Payer: Medicare Other | Admitting: Family Medicine

## 2016-12-11 ENCOUNTER — Telehealth: Payer: Self-pay | Admitting: Family Medicine

## 2016-12-11 ENCOUNTER — Telehealth: Payer: Self-pay

## 2016-12-11 ENCOUNTER — Encounter: Payer: Self-pay | Admitting: Family Medicine

## 2016-12-11 VITALS — BP 134/62 | HR 87 | Temp 98.3°F | Ht 65.0 in | Wt 131.2 lb

## 2016-12-11 DIAGNOSIS — F17200 Nicotine dependence, unspecified, uncomplicated: Secondary | ICD-10-CM | POA: Diagnosis not present

## 2016-12-11 DIAGNOSIS — J441 Chronic obstructive pulmonary disease with (acute) exacerbation: Secondary | ICD-10-CM

## 2016-12-11 DIAGNOSIS — E871 Hypo-osmolality and hyponatremia: Secondary | ICD-10-CM

## 2016-12-11 DIAGNOSIS — R0602 Shortness of breath: Secondary | ICD-10-CM | POA: Diagnosis not present

## 2016-12-11 DIAGNOSIS — F411 Generalized anxiety disorder: Secondary | ICD-10-CM | POA: Diagnosis not present

## 2016-12-11 DIAGNOSIS — Z23 Encounter for immunization: Secondary | ICD-10-CM | POA: Diagnosis not present

## 2016-12-11 DIAGNOSIS — I10 Essential (primary) hypertension: Secondary | ICD-10-CM

## 2016-12-11 DIAGNOSIS — M25551 Pain in right hip: Secondary | ICD-10-CM | POA: Diagnosis not present

## 2016-12-11 DIAGNOSIS — R600 Localized edema: Secondary | ICD-10-CM | POA: Diagnosis not present

## 2016-12-11 DIAGNOSIS — R739 Hyperglycemia, unspecified: Secondary | ICD-10-CM | POA: Diagnosis not present

## 2016-12-11 DIAGNOSIS — D72829 Elevated white blood cell count, unspecified: Secondary | ICD-10-CM

## 2016-12-11 DIAGNOSIS — Y92099 Unspecified place in other non-institutional residence as the place of occurrence of the external cause: Secondary | ICD-10-CM

## 2016-12-11 DIAGNOSIS — Y92009 Unspecified place in unspecified non-institutional (private) residence as the place of occurrence of the external cause: Secondary | ICD-10-CM

## 2016-12-11 DIAGNOSIS — R110 Nausea: Secondary | ICD-10-CM

## 2016-12-11 DIAGNOSIS — R413 Other amnesia: Secondary | ICD-10-CM

## 2016-12-11 DIAGNOSIS — W19XXXA Unspecified fall, initial encounter: Secondary | ICD-10-CM

## 2016-12-11 LAB — CBC WITH DIFFERENTIAL/PLATELET
BASOS PCT: 0.3 % (ref 0.0–3.0)
Basophils Absolute: 0 10*3/uL (ref 0.0–0.1)
Eosinophils Absolute: 0.3 10*3/uL (ref 0.0–0.7)
Eosinophils Relative: 2.4 % (ref 0.0–5.0)
HEMATOCRIT: 47.6 % — AB (ref 36.0–46.0)
HEMOGLOBIN: 15.4 g/dL — AB (ref 12.0–15.0)
LYMPHS PCT: 7.9 % — AB (ref 12.0–46.0)
Lymphs Abs: 1 10*3/uL (ref 0.7–4.0)
MCHC: 32.4 g/dL (ref 30.0–36.0)
MCV: 81.8 fl (ref 78.0–100.0)
Monocytes Absolute: 0.9 10*3/uL (ref 0.1–1.0)
Monocytes Relative: 7 % (ref 3.0–12.0)
Neutro Abs: 10.4 10*3/uL — ABNORMAL HIGH (ref 1.4–7.7)
Neutrophils Relative %: 82.4 % — ABNORMAL HIGH (ref 43.0–77.0)
Platelets: 345 10*3/uL (ref 150.0–400.0)
RBC: 5.82 Mil/uL — AB (ref 3.87–5.11)
RDW: 17.4 % — AB (ref 11.5–15.5)
WBC: 12.6 10*3/uL — AB (ref 4.0–10.5)

## 2016-12-11 LAB — COMPREHENSIVE METABOLIC PANEL
ALBUMIN: 3.7 g/dL (ref 3.5–5.2)
ALK PHOS: 97 U/L (ref 39–117)
ALT: 37 U/L — AB (ref 0–35)
AST: 31 U/L (ref 0–37)
BILIRUBIN TOTAL: 0.5 mg/dL (ref 0.2–1.2)
BUN: 8 mg/dL (ref 6–23)
CALCIUM: 9 mg/dL (ref 8.4–10.5)
CHLORIDE: 85 meq/L — AB (ref 96–112)
CO2: 33 mEq/L — ABNORMAL HIGH (ref 19–32)
CREATININE: 0.66 mg/dL (ref 0.40–1.20)
GFR: 92.35 mL/min (ref >60.00)
Glucose, Bld: 95 mg/dL (ref 70–99)
Potassium: 5 mEq/L (ref 3.5–5.1)
Sodium: 125 mEq/L — ABNORMAL LOW (ref 135–145)
TOTAL PROTEIN: 6.8 g/dL (ref 6.0–8.3)

## 2016-12-11 LAB — BRAIN NATRIURETIC PEPTIDE: PRO B NATRI PEPTIDE: 315 pg/mL — AB (ref 0.0–100.0)

## 2016-12-11 LAB — LIPID PANEL
CHOL/HDL RATIO: 2
Cholesterol: 103 mg/dL (ref 0–200)
HDL: 43 mg/dL (ref >39.00)
LDL Cholesterol: 42 mg/dL (ref 0–99)
NONHDL: 60.32
Triglycerides: 91 mg/dL (ref 0.0–149.0)
VLDL: 18.2 mg/dL (ref 0.0–40.0)

## 2016-12-11 LAB — HEMOGLOBIN A1C: HEMOGLOBIN A1C: 6.2 % (ref 4.6–6.5)

## 2016-12-11 LAB — TSH: TSH: 0.55 u[IU]/mL (ref 0.35–4.50)

## 2016-12-11 NOTE — Telephone Encounter (Signed)
Called report for chest xray. Report in Epic and taken to Dr Glori Bickers.

## 2016-12-11 NOTE — Assessment & Plan Note (Signed)
bp in fair control at this time  BP Readings from Last 1 Encounters:  12/11/16 134/62   No changes needed Disc lifstyle change with low sodium diet and exercise  Labs today

## 2016-12-11 NOTE — Progress Notes (Signed)
Pre visit review using our clinic review tool, if applicable. No additional management support is needed unless otherwise documented below in the visit note. 

## 2016-12-11 NOTE — Assessment & Plan Note (Signed)
Worse lately -suspect due to copd  Ref to pulmonary Continue current inhalers cxr today

## 2016-12-11 NOTE — Assessment & Plan Note (Signed)
Worse after eating salty food at son's house and sitting with feet down  Recommend elevation Improved today  Has support hose as well  BNP today and cxr also  Has baseline sob on exertion

## 2016-12-11 NOTE — Assessment & Plan Note (Signed)
This may be worsened by chronic hypoxia and also anxiety  Long disc  Will work on copd tx/eval by pulmonary (may need 02 ) Then re eval  Last MMS 27/30 No acute confusion

## 2016-12-11 NOTE — Assessment & Plan Note (Signed)
S/p fall  Xray of hip /pelvis today  Fairly good rom on exam

## 2016-12-11 NOTE — Progress Notes (Signed)
Subjective:    Patient ID: Nancy Herrera, female    DOB: [birth year], 75 y.o.   MRN: [id number]  HPI Here for f/u of multiple acute and chronic issues incl memory loss/ med management of anx, foot swelling, breathing and recent fall   Living at her kid's house (rotates) after husband died   Wt Readings from Last 3 Encounters:  12/11/16 131 lb 4 oz (59.5 kg)  09/22/16 133 lb (60.3 kg)  08/30/16 132 lb 12 oz (60.2 kg)  wt is down a few lb- states she is not eating well , will prep pb/ crackers/ cheese/ yogurt  bmi of 21.8  Fall/injury  Was at the counter with son and her family  Lifted leg to get onto a stool and tipped over that way  Pain was mostly in R groin area - has improved but not gone  Also hit her elbow (right)-improved --she also leans on elbows  Actually fell twice from the stools-they have since been moved  Does not use a walker or cane at home- family will look for her walker    Does not move a lot / does not get up and move around (family notices) ? Due to energy level and breathing    Foot swelling  Both feet  Has happened since Wednesday  Worse on Saturday (she did eat a pre prepared meal)  Tries to elevate them  Does not wear her supp stockings Poss at her son's house- sitting more with feet down    Copd in a smoker Dry cough-persistent  Smoking status  Due for a flu shot but utd pneumonia vaccines  Pulse ox 80% first check  Using anoro ellipta inhaler  Albuterol rescue Still smoking - (smokes more if she is by herself) -esp if she is not smoking  6-8 cig when she stays with more than a kids  Anxiety -worse with change in environment  More trouble initially when she moves locations (sleeping particularly)  She does not want to stay by herself (does not take meds or eat correctly then)  Would like to bring someone in to her home  Pt says her anxiety is "pretty good" - does get quite scared to fall  On effexor-that helps  Gets nauseated with anxiety    Had issues with phenergan ? Overuse  Has not needed it after going to her son's house   Still bad moments with grief   Memory/ mental status  MMS exam 27 in 5/17 Worse? Poss due to hypoxia  Not on any xanax    bp is stable today  No cp or palpitations or headaches or edema  No side effects to medicines  BP Readings from Last 3 Encounters:  12/11/16 134/62  09/22/16 (!) 128/59  08/30/16 108/62      Hx of hyperglycemia Lab Results  Component Value Date   HGBA1C 6.4 08/27/2015    Patient Active Problem List   Diagnosis Date Noted  . Right hip pain 12/11/2016  . Pulmonary emphysema ([place]) 09/22/2016  . Grief reaction 08/30/2016  . Nausea without vomiting 06/02/2016  . Memory loss 05/03/2016  . Encounter for medication review 02/22/2016  . Frequent urination 02/22/2016  . Osteopenia 12/05/2015  . Fall at home 11/03/2015  . COPD exacerbation ([place]) 09/24/2015  . SOB (shortness of breath) on exertion 09/14/2015  . Routine general medical examination at a health care facility 08/27/2015  . Estrogen deficiency 08/27/2015  . Encounter for Medicare annual wellness exam 07/17/2014  .  Colon cancer screening 07/17/2014  . Pedal edema 03/09/2014  . Other screening mammogram 11/18/2012  . Immunization counseling 08/02/2012  . 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 SHOULDER PAIN, BILATERAL 12/06/2010  . ALLERGIC RHINITIS 08/23/2010  . Hyperglycemia 08/23/2010  . Hyperlipidemia 04/10/2007  . Generalized anxiety disorder 04/10/2007  . Smoker 04/10/2007  . DEPRESSION 04/10/2007  . Essential hypertension 04/10/2007  . GERD 04/10/2007  . DEGENERATIVE DISC DISEASE, LUMBOSACRAL SPINE 04/10/2007   Past Medical History:  Diagnosis Date  . Allergy    allergic rhinitis  . Anxiety   . Cancer ([place])   . Degenerative disc disease, lumbar   . Depression   . GERD (gastroesophageal reflux disease)   . Hyperglycemia    controlled by diet  . Hyperlipidemia   . Hypertension     Past Surgical History:  Procedure Laterality Date  . CATARACT EXTRACTION     Social History  Substance Use Topics  . Smoking status: Current Every Day Smoker    Packs/day: 1.00    Types: Cigarettes  . Smokeless tobacco: Never Used  . Alcohol use No   Family History  Problem Relation Age of Onset  . Cancer Mother     breast cancer  . Cancer Sister     breast cancer & lung cancer  . Breast cancer Daughter    No Known Allergies Current Outpatient Prescriptions on File Prior to Visit  Medication Sig Dispense Refill  . acetaminophen (TYLENOL) 500 MG tablet OTC as directed     . albuterol (PROVENTIL HFA;VENTOLIN HFA) 108 (90 Base) MCG/ACT inhaler Inhale 2 puffs into the lungs every 4 (four) hours as needed for wheezing or shortness of breath. 1 Inhaler 11  . aspirin 81 MG tablet Take 81 mg by mouth daily.      Marland Kitchen glucose blood test strip To check glucose once daily and as needed for diabetes type 2     . glucose monitoring kit (FREESTYLE) monitoring kit To check glucose once daily and as needed for diabetes (250.0)     . Lancets MISC To check glucose once daily and as needed for diabetes type 2     . lisinopril (PRINIVIL,ZESTRIL) 10 MG tablet TAKE 1 TABLET BY MOUTH DAILY 90 tablet 1  . ranitidine (ZANTAC) 150 MG tablet Take 1 tablet (150 mg total) by mouth 2 (two) times daily. 60 tablet 11  . simvastatin (ZOCOR) 40 MG tablet TAKE 1 TABLET BY MOUTH EVERY DAY 90 tablet 1  . umeclidinium-vilanterol (ANORO ELLIPTA) 62.5-25 MCG/INH AEPB Inhale 1 puff into the lungs daily. 1 each 11  . venlafaxine XR (EFFEXOR XR) 150 MG 24 hr capsule Take 1 capsule (150 mg total) by mouth daily with breakfast. 30 capsule 11  . promethazine (PHENERGAN) 25 MG tablet Take tab daily as needed (Patient not taking: Reported on 12/11/2016) 5 tablet 0   No current facility-administered medications on file prior to visit.     Review of Systems Review of Systems  Constitutional: Negative for fever, pos for  fatigue and appetite and wt change .  Eyes: Negative for pain and visual disturbance.  Respiratory: pos for cough and shortness of breath.   Cardiovascular: Negative for cp or palpitations    Gastrointestinal: Negative for , diarrhea and constipation. neg for blood in stool or dark stool  Genitourinary: Negative for urgency and frequency. neg for dysuria or blood in urine  Skin: Negative for pallor or rash   Neurological: Negative for weakness, light-headedness, numbness and headaches. pos  for poor balance and falls  Hematological: Negative for adenopathy. Does not bruise/bleed easily.  Psychiatric/Behavioral: Negative for dysphoric mood. The patient is nervous/anxious.        Objective:   Physical Exam  Constitutional: She appears well-developed and well-nourished. No distress.  Frail appearing elderly female in wheelchair  Supportive family present   HENT:  Head: Normocephalic and atraumatic.  Mouth/Throat: Oropharynx is clear and moist.  Eyes: Conjunctivae and EOM are normal. Pupils are equal, round, and reactive to light. Right eye exhibits no discharge. Left eye exhibits no discharge. No scleral icterus.  Neck: Normal range of motion. Neck supple. No JVD present. Carotid bruit is not present. No thyromegaly present.  Cardiovascular: Normal rate, regular rhythm, normal heart sounds and intact distal pulses.  Exam reveals no gallop.   Pulmonary/Chest: Effort normal and breath sounds normal. No respiratory distress. She has no wheezes. She has no rales.  No crackles  Diffusely distant bs  Upper airway sounds cleared by cough   Few scattered rhonchi and wheeze on forced exp Prolonged exp phase Fair air exch  Abdominal: Soft. Bowel sounds are normal. She exhibits no distension, no abdominal bruit and no mass. There is no tenderness.  Musculoskeletal: She exhibits no edema or tenderness.  Nl rom R hip  Pt cannot walk w/o asst  Lymphadenopathy:    She has no cervical adenopathy.   Neurological: She is alert. She has normal reflexes. No cranial nerve deficit. She exhibits normal muscle tone. Coordination normal.  Poor balance and general strength  Skin: Skin is warm and dry. No rash noted.  Psychiatric: Her speech is normal and behavior is normal. Her mood appears anxious. Her affect is not blunt and not labile. Thought content is not paranoid. Cognition and memory are impaired. She does not exhibit a depressed mood. She expresses no homicidal and no suicidal ideation. She exhibits abnormal recent memory.  Fairly good mood today  Mildly anxious           Assessment & Plan:   Problem List Items Addressed This Visit      Cardiovascular and Mediastinum   Essential hypertension - Primary    bp in fair control at this time  BP Readings from Last 1 Encounters:  12/11/16 134/62   No changes needed Disc lifstyle change with low sodium diet and exercise  Labs today      Relevant Orders   CBC with Differential/Platelet (Completed)   Comprehensive metabolic panel (Completed)   TSH (Completed)   Lipid panel (Completed)     Respiratory   COPD exacerbation ([place])    Copd seems to be gradually worsening  Strongly recommend that she see pulmonary- she has been hesitant in the past Enc smoking cessation  Less exercise tolerance and lower pulse ox on exertion Affecting quality of life May need chronic 02 Hypoxia may greatly affect mentation and other functions cxr today  Ref to pulmonary      Relevant Orders   Ambulatory referral to Pulmonology   DG Chest 2 View (Completed)     Other   SOB (shortness of breath) on exertion    Worse lately -suspect due to copd  Ref to pulmonary Continue current inhalers cxr today      Relevant Orders   Brain natriuretic peptide (Completed)   Smoker   Relevant Orders   Ambulatory referral to Pulmonology   DG Chest 2 View (Completed)   Right hip pain    S/p fall  Xray of hip Jerene Pitch  today  Fairly good rom on  exam      Relevant Orders   DG HIP UNILAT WITH PELVIS 2-3 VIEWS RIGHT (Completed)   Pedal edema    Worse after eating salty food at son's house and sitting with feet down  Recommend elevation Improved today  Has support hose as well  BNP today and cxr also  Has baseline sob on exertion       Relevant Orders   Brain natriuretic peptide (Completed)   Nausea without vomiting    Less lately  Per pt this is her anxiety equivalent      Memory loss    This may be worsened by chronic hypoxia and also anxiety  Long disc  Will work on copd tx/eval by pulmonary (may need 02 ) Then re eval  Last MMS 27/30 No acute confusion       Hyperglycemia    Due for A1C Eats what she wants- dec appetite with time and copd/anxeity       Relevant Orders   Hemoglobin A1c (Completed)   Generalized anxiety disorder   Fall at home    Disc fall risk in detail-family is making changes in home Stressed imp of walker or cane at all times No doubt chronic hypoxia from copd is worsening balance that is already bad Pt does need 24 hour care at this point       Other Visit Diagnoses    Need for influenza vaccination       Relevant Orders   Flu Vaccine QUAD 36+ mos IM (Completed)

## 2016-12-11 NOTE — Assessment & Plan Note (Signed)
Less lately  Per pt this is her anxiety equivalent

## 2016-12-11 NOTE — Telephone Encounter (Signed)
Patient's daughter called and asked to be called back with any results from today's visit.

## 2016-12-11 NOTE — Assessment & Plan Note (Signed)
Disc fall risk in detail-family is making changes in home Stressed imp of walker or cane at all times No doubt chronic hypoxia from copd is worsening balance that is already bad Pt does need 24 hour care at this point

## 2016-12-11 NOTE — Telephone Encounter (Signed)
See xray call report

## 2016-12-11 NOTE — Assessment & Plan Note (Signed)
Copd seems to be gradually worsening  Strongly recommend that she see pulmonary- she has been hesitant in the past Enc smoking cessation  Less exercise tolerance and lower pulse ox on exertion Affecting quality of life May need chronic 02 Hypoxia may greatly affect mentation and other functions cxr today  Ref to pulmonary

## 2016-12-11 NOTE — Telephone Encounter (Signed)
Called pt-see result note

## 2016-12-11 NOTE — Assessment & Plan Note (Addendum)
Due for A1C Eats what she wants- dec appetite with time and copd/anxeity

## 2016-12-11 NOTE — Patient Instructions (Signed)
Flu shot today  Xray of hip and chest today  Labs today  Stop at check out for referral to a pulmonary specialist   I think that your lack of oxygen from copd may affect memory and mood and endurance and general state of well being  Do keep thinking about quitting smoking !   A patch would be ok

## 2016-12-11 NOTE — Telephone Encounter (Signed)
Aware- see result note  

## 2016-12-11 NOTE — Telephone Encounter (Signed)
Pt was seen today in the office

## 2016-12-12 DIAGNOSIS — E871 Hypo-osmolality and hyponatremia: Secondary | ICD-10-CM | POA: Insufficient documentation

## 2016-12-12 DIAGNOSIS — D72829 Elevated white blood cell count, unspecified: Secondary | ICD-10-CM | POA: Insufficient documentation

## 2016-12-12 NOTE — Telephone Encounter (Signed)
-----   Message from Tammi Sou, [place] sent at 12/12/2016 11:25 AM EST ----- Pt is staying at son's house in [place] right now so they would like you to put the lab orders in the computer and the UA and they will call the [facility] [place] and get a lab appt scheduled but pt's daughter Rodena Piety wanted to know how soon did you want pt to have the labs done.  Daughter hasn't spoken with pt's son who was going to tell pt about xray results. She said for now they are going to hold off on the CT until she gets her mother's input on how to proceed and once she knows she will call us back and let us know

## 2016-12-12 NOTE — Telephone Encounter (Signed)
I put in future labs for bmp/ serum osmolality and urine cx   Please go get this in the next 2-3 days   Let me know about the CT when ready  Let Geanie Berlin

## 2016-12-12 NOTE — Telephone Encounter (Signed)
Daughter notified lab orders are in and to get them in the next 2-3 days and just let us know when they are ready to proceed with the CT scan

## 2016-12-14 ENCOUNTER — Telehealth: Payer: Self-pay | Admitting: Family Medicine

## 2016-12-14 ENCOUNTER — Other Ambulatory Visit (INDEPENDENT_AMBULATORY_CARE_PROVIDER_SITE_OTHER): Payer: Medicare Other

## 2016-12-14 DIAGNOSIS — R918 Other nonspecific abnormal finding of lung field: Secondary | ICD-10-CM | POA: Insufficient documentation

## 2016-12-14 DIAGNOSIS — E871 Hypo-osmolality and hyponatremia: Secondary | ICD-10-CM

## 2016-12-14 DIAGNOSIS — D72829 Elevated white blood cell count, unspecified: Secondary | ICD-10-CM

## 2016-12-14 LAB — RENAL FUNCTION PANEL
Albumin: 3.8 g/dL (ref 3.5–5.2)
BUN: 10 mg/dL (ref 6–23)
CALCIUM: 9.6 mg/dL (ref 8.4–10.5)
CO2: 36 meq/L — AB (ref 19–32)
CREATININE: 0.64 mg/dL (ref 0.40–1.20)
Chloride: 87 mEq/L — ABNORMAL LOW (ref 96–112)
GFR: 95.69 mL/min (ref >60.00)
Glucose, Bld: 120 mg/dL — ABNORMAL HIGH (ref 70–99)
POTASSIUM: 4.8 meq/L (ref 3.5–5.1)
Phosphorus: 3.8 mg/dL (ref 2.3–4.6)
Sodium: 126 mEq/L — ABNORMAL LOW (ref 135–145)

## 2016-12-14 NOTE — Telephone Encounter (Signed)
Nancy Herrera called wanted to go ahead with ct scan for [name].

## 2016-12-14 NOTE — Telephone Encounter (Signed)
Referral done-will route to [facility]

## 2016-12-15 ENCOUNTER — Telehealth: Payer: Self-pay

## 2016-12-15 LAB — URINE CULTURE

## 2016-12-15 NOTE — Telephone Encounter (Signed)
Patient's daughter Rodena Piety calls to check on lab results.  Part of labs are still pending but was able to share with her that the NA level was still low without much change from last.  She is aware that Dr. Glori Bickers is following closely and will communicate with family when they come in.  Rodena Piety aware and thankful for the update.

## 2016-12-17 NOTE — Telephone Encounter (Signed)
I spoke with her regarding results

## 2016-12-19 ENCOUNTER — Encounter (HOSPITAL_COMMUNITY): Payer: Self-pay | Admitting: Emergency Medicine

## 2016-12-19 ENCOUNTER — Other Ambulatory Visit: Payer: Medicare Other

## 2016-12-19 ENCOUNTER — Inpatient Hospital Stay (HOSPITAL_COMMUNITY)
Admission: EM | Admit: 2016-12-19 | Discharge: 2016-12-25 | DRG: 193 | Disposition: E | Payer: Medicare Other | Attending: Internal Medicine | Admitting: Internal Medicine

## 2016-12-19 ENCOUNTER — Emergency Department (HOSPITAL_COMMUNITY): Payer: Medicare Other

## 2016-12-19 DIAGNOSIS — C787 Secondary malignant neoplasm of liver and intrahepatic bile duct: Secondary | ICD-10-CM | POA: Diagnosis present

## 2016-12-19 DIAGNOSIS — F329 Major depressive disorder, single episode, unspecified: Secondary | ICD-10-CM | POA: Diagnosis present

## 2016-12-19 DIAGNOSIS — E785 Hyperlipidemia, unspecified: Secondary | ICD-10-CM | POA: Diagnosis present

## 2016-12-19 DIAGNOSIS — Z6821 Body mass index (BMI) 21.0-21.9, adult: Secondary | ICD-10-CM

## 2016-12-19 DIAGNOSIS — F17200 Nicotine dependence, unspecified, uncomplicated: Secondary | ICD-10-CM | POA: Diagnosis present

## 2016-12-19 DIAGNOSIS — E43 Unspecified severe protein-calorie malnutrition: Secondary | ICD-10-CM | POA: Diagnosis present

## 2016-12-19 DIAGNOSIS — J440 Chronic obstructive pulmonary disease with acute lower respiratory infection: Secondary | ICD-10-CM | POA: Diagnosis present

## 2016-12-19 DIAGNOSIS — E222 Syndrome of inappropriate secretion of antidiuretic hormone: Secondary | ICD-10-CM | POA: Diagnosis present

## 2016-12-19 DIAGNOSIS — Z79899 Other long term (current) drug therapy: Secondary | ICD-10-CM

## 2016-12-19 DIAGNOSIS — Z801 Family history of malignant neoplasm of trachea, bronchus and lung: Secondary | ICD-10-CM | POA: Diagnosis not present

## 2016-12-19 DIAGNOSIS — C7951 Secondary malignant neoplasm of bone: Secondary | ICD-10-CM | POA: Diagnosis present

## 2016-12-19 DIAGNOSIS — Z515 Encounter for palliative care: Secondary | ICD-10-CM | POA: Diagnosis present

## 2016-12-19 DIAGNOSIS — Z7982 Long term (current) use of aspirin: Secondary | ICD-10-CM

## 2016-12-19 DIAGNOSIS — E871 Hypo-osmolality and hyponatremia: Secondary | ICD-10-CM | POA: Diagnosis not present

## 2016-12-19 DIAGNOSIS — R739 Hyperglycemia, unspecified: Secondary | ICD-10-CM | POA: Diagnosis present

## 2016-12-19 DIAGNOSIS — D72829 Elevated white blood cell count, unspecified: Secondary | ICD-10-CM

## 2016-12-19 DIAGNOSIS — E874 Mixed disorder of acid-base balance: Secondary | ICD-10-CM | POA: Diagnosis present

## 2016-12-19 DIAGNOSIS — I10 Essential (primary) hypertension: Secondary | ICD-10-CM | POA: Diagnosis present

## 2016-12-19 DIAGNOSIS — J189 Pneumonia, unspecified organism: Secondary | ICD-10-CM | POA: Diagnosis present

## 2016-12-19 DIAGNOSIS — F17210 Nicotine dependence, cigarettes, uncomplicated: Secondary | ICD-10-CM | POA: Diagnosis present

## 2016-12-19 DIAGNOSIS — Z66 Do not resuscitate: Secondary | ICD-10-CM | POA: Diagnosis present

## 2016-12-19 DIAGNOSIS — J9621 Acute and chronic respiratory failure with hypoxia: Secondary | ICD-10-CM | POA: Diagnosis present

## 2016-12-19 DIAGNOSIS — R918 Other nonspecific abnormal finding of lung field: Secondary | ICD-10-CM

## 2016-12-19 DIAGNOSIS — C3490 Malignant neoplasm of unspecified part of unspecified bronchus or lung: Secondary | ICD-10-CM | POA: Diagnosis not present

## 2016-12-19 DIAGNOSIS — J441 Chronic obstructive pulmonary disease with (acute) exacerbation: Secondary | ICD-10-CM | POA: Diagnosis present

## 2016-12-19 DIAGNOSIS — J9622 Acute and chronic respiratory failure with hypercapnia: Secondary | ICD-10-CM | POA: Diagnosis present

## 2016-12-19 DIAGNOSIS — K219 Gastro-esophageal reflux disease without esophagitis: Secondary | ICD-10-CM | POA: Diagnosis present

## 2016-12-19 DIAGNOSIS — Z803 Family history of malignant neoplasm of breast: Secondary | ICD-10-CM | POA: Diagnosis not present

## 2016-12-19 DIAGNOSIS — J449 Chronic obstructive pulmonary disease, unspecified: Secondary | ICD-10-CM | POA: Diagnosis present

## 2016-12-19 DIAGNOSIS — R0602 Shortness of breath: Secondary | ICD-10-CM

## 2016-12-19 HISTORY — DX: Chronic obstructive pulmonary disease, unspecified: J44.9

## 2016-12-19 LAB — COMPREHENSIVE METABOLIC PANEL
ALBUMIN: 3.4 g/dL — AB (ref 3.5–5.0)
ALT: 22 U/L (ref 14–54)
AST: 28 U/L (ref 15–41)
Alkaline Phosphatase: 101 U/L (ref 38–126)
Anion gap: 10 (ref 5–15)
BILIRUBIN TOTAL: 0.5 mg/dL (ref 0.3–1.2)
BUN: 11 mg/dL (ref 6–20)
CHLORIDE: 88 mmol/L — AB (ref 101–111)
CO2: 29 mmol/L (ref 22–32)
Calcium: 8.2 mg/dL — ABNORMAL LOW (ref 8.9–10.3)
Creatinine, Ser: 0.63 mg/dL (ref 0.44–1.00)
GFR calc Af Amer: >60 mL/min (ref >60)
GFR calc non Af Amer: >60 mL/min (ref >60)
GLUCOSE: 166 mg/dL — AB (ref 65–99)
POTASSIUM: 3.8 mmol/L (ref 3.5–5.1)
Sodium: 127 mmol/L — ABNORMAL LOW (ref 135–145)
Total Protein: 6.7 g/dL (ref 6.5–8.1)

## 2016-12-19 LAB — OSMOLALITY: OSMOLALITY: 271 mosm/kg — AB (ref 278–305)

## 2016-12-19 LAB — CBC WITH DIFFERENTIAL/PLATELET
Basophils Absolute: 0 10*3/uL (ref 0.0–0.1)
Basophils Relative: 0 %
EOS ABS: 0 10*3/uL (ref 0.0–0.7)
Eosinophils Relative: 0 %
HCT: 46.7 % — ABNORMAL HIGH (ref 36.0–46.0)
HEMOGLOBIN: 15.4 g/dL — AB (ref 12.0–15.0)
LYMPHS ABS: 0.7 10*3/uL (ref 0.7–4.0)
Lymphocytes Relative: 7 %
MCH: 26.7 pg (ref 26.0–34.0)
MCHC: 33 g/dL (ref 30.0–36.0)
MCV: 81.1 fL (ref 78.0–100.0)
Monocytes Absolute: 0.8 10*3/uL (ref 0.1–1.0)
Monocytes Relative: 8 %
NEUTROS ABS: 8.3 10*3/uL — AB (ref 1.7–7.7)
NEUTROS PCT: 84 %
Platelets: 206 10*3/uL (ref 150–400)
RBC: 5.76 MIL/uL — AB (ref 3.87–5.11)
RDW: 16.9 % — ABNORMAL HIGH (ref 11.5–15.5)
WBC: 9.9 10*3/uL (ref 4.0–10.5)

## 2016-12-19 LAB — I-STAT CG4 LACTIC ACID, ED: LACTIC ACID, VENOUS: 1.33 mmol/L (ref 0.5–1.9)

## 2016-12-19 LAB — BRAIN NATRIURETIC PEPTIDE: B NATRIURETIC PEPTIDE 5: 171.9 pg/mL — AB (ref 0.0–100.0)

## 2016-12-19 LAB — TROPONIN I: Troponin I: <0.03 ng/mL (ref <0.03)

## 2016-12-19 MED ORDER — ENOXAPARIN SODIUM 40 MG/0.4ML ~~LOC~~ SOLN
40.0000 mg | Freq: Every day | SUBCUTANEOUS | Status: DC
  Administered 2016-12-20 – 2016-12-21 (×3): 40 mg via SUBCUTANEOUS
  Filled 2016-12-19 (×3): qty 0.4

## 2016-12-19 MED ORDER — AZITHROMYCIN 500 MG IV SOLR: 500.0000 mg | INTRAVENOUS | Status: DC

## 2016-12-19 MED ORDER — SODIUM CHLORIDE 0.9 % IV SOLN
INTRAVENOUS | Status: DC
  Administered 2016-12-19: 10 mL/h via INTRAVENOUS

## 2016-12-19 MED ORDER — ASPIRIN EC 81 MG PO TBEC
81.0000 mg | DELAYED_RELEASE_TABLET | Freq: Every day | ORAL | Status: DC
  Administered 2016-12-21: 81 mg via ORAL
  Filled 2016-12-19 (×3): qty 1

## 2016-12-19 MED ORDER — LACTATED RINGERS IV SOLN
INTRAVENOUS | Status: DC
  Administered 2016-12-19 – 2016-12-23 (×2): via INTRAVENOUS

## 2016-12-19 MED ORDER — ALBUTEROL SULFATE (2.5 MG/3ML) 0.083% IN NEBU
5.0000 mg | INHALATION_SOLUTION | Freq: Once | RESPIRATORY_TRACT | Status: AC
  Administered 2016-12-19: 5 mg via RESPIRATORY_TRACT
  Filled 2016-12-19: qty 6

## 2016-12-19 MED ORDER — VENLAFAXINE HCL ER 150 MG PO CP24
150.0000 mg | ORAL_CAPSULE | Freq: Every day | ORAL | Status: DC
  Administered 2016-12-21: 150 mg via ORAL
  Filled 2016-12-19 (×2): qty 1

## 2016-12-19 MED ORDER — LORAZEPAM 2 MG/ML IJ SOLN
1.0000 mg | Freq: Once | INTRAMUSCULAR | Status: AC
  Administered 2016-12-19: 1 mg via INTRAVENOUS
  Filled 2016-12-19: qty 1

## 2016-12-19 MED ORDER — SIMVASTATIN 40 MG PO TABS
40.0000 mg | ORAL_TABLET | Freq: Every day | ORAL | Status: DC
  Administered 2016-12-21: 40 mg via ORAL
  Filled 2016-12-19 (×3): qty 1

## 2016-12-19 MED ORDER — DEXTROSE 5 % IV SOLN
1.0000 g | INTRAVENOUS | Status: DC
  Administered 2016-12-20 – 2016-12-22 (×3): 1 g via INTRAVENOUS
  Filled 2016-12-19 (×3): qty 10

## 2016-12-19 MED ORDER — NICOTINE 21 MG/24HR TD PT24
21.0000 mg | MEDICATED_PATCH | Freq: Every day | TRANSDERMAL | Status: DC
  Administered 2016-12-20 – 2016-12-23 (×4): 21 mg via TRANSDERMAL
  Filled 2016-12-19 (×4): qty 1

## 2016-12-19 MED ORDER — FAMOTIDINE 20 MG PO TABS
20.0000 mg | ORAL_TABLET | Freq: Two times a day (BID) | ORAL | Status: DC
  Administered 2016-12-21 (×2): 20 mg via ORAL
  Filled 2016-12-19 (×4): qty 1

## 2016-12-19 MED ORDER — METHYLPREDNISOLONE SODIUM SUCC 125 MG IJ SOLR
125.0000 mg | Freq: Once | INTRAMUSCULAR | Status: AC
  Administered 2016-12-19: 125 mg via INTRAVENOUS
  Filled 2016-12-19: qty 2

## 2016-12-19 MED ORDER — LISINOPRIL 10 MG PO TABS
10.0000 mg | ORAL_TABLET | Freq: Every day | ORAL | Status: DC
  Administered 2016-12-21: 10 mg via ORAL
  Filled 2016-12-19 (×3): qty 1

## 2016-12-19 MED ORDER — ALBUTEROL (5 MG/ML) CONTINUOUS INHALATION SOLN
10.0000 mg/h | INHALATION_SOLUTION | Freq: Once | RESPIRATORY_TRACT | Status: AC
  Administered 2016-12-19: 10 mg/h via RESPIRATORY_TRACT
  Filled 2016-12-19: qty 20

## 2016-12-19 MED ORDER — DEXTROSE 5 % IV SOLN
1.0000 g | INTRAVENOUS | Status: DC
  Administered 2016-12-19: 1 g via INTRAVENOUS
  Filled 2016-12-19: qty 10

## 2016-12-19 MED ORDER — UMECLIDINIUM-VILANTEROL 62.5-25 MCG/INH IN AEPB
1.0000 | INHALATION_SPRAY | Freq: Every day | RESPIRATORY_TRACT | Status: DC
  Filled 2016-12-19: qty 14

## 2016-12-19 MED ORDER — DEXTROSE 5 % IV SOLN
500.0000 mg | INTRAVENOUS | Status: DC
Start: 2016-12-19 — End: 2016-12-20
  Administered 2016-12-19: 500 mg via INTRAVENOUS
  Filled 2016-12-19: qty 500

## 2016-12-19 NOTE — Telephone Encounter (Signed)
Osmolality was a little low- but not terribly conclusive.  I still suspect that her low Na may be related to a lung cancer.  Do they have her CT set up yet ? (please talk to Rodena Piety- do not talk to or leave message for pt since she may not entirely know everything yet .  How is she eating and drinking?  How is she feeling

## 2016-12-19 NOTE — Telephone Encounter (Signed)
[facility] called. Pt's stat labs are now available for viewing.

## 2016-12-19 NOTE — ED Provider Notes (Signed)
[facility] DEPT Provider Note   CSN: [id number] Arrival date & time: 11/26/2016  1549     History   Chief Complaint Chief Complaint  Patient presents with  . Shortness of Breath  . Fall    HPI Nancy Herrera is a 75 y.o. female.  75 y/o female here w/ increasing dyspnea x several days Has h/o copd (continues to smoke tobacco) and lung mass suspicious for CA Recently been tx for hyponatremia and renal insuff Pt had a mechanical fall today but w/o significant injury Ems called due to worsening dyspnea and pulse ox was in the 70's (pt denies chronic use of O2) and wheezing was present ( tx w/ albuterol and transported here)      Past Medical History:  Diagnosis Date  . Allergy    allergic rhinitis  . Anxiety   . Cancer ([place])   . Degenerative disc disease, lumbar   . Depression   . GERD (gastroesophageal reflux disease)   . Hyperglycemia    controlled by diet  . Hyperlipidemia   . Hypertension     Patient Active Problem List   Diagnosis Date Noted  . Lung mass 12/14/2016  . Hyponatremia 12/12/2016  . Leukocytosis 12/12/2016  . Right hip pain 12/11/2016  . Pulmonary emphysema ([place]) 09/22/2016  . Grief reaction 08/30/2016  . Nausea without vomiting 06/02/2016  . Memory loss 05/03/2016  . Encounter for medication review 02/22/2016  . Frequent urination 02/22/2016  . Osteopenia 12/05/2015  . Fall at home 11/03/2015  . COPD exacerbation ([place]) 09/24/2015  . SOB (shortness of breath) on exertion 09/14/2015  . Routine general medical examination at a health care facility 08/27/2015  . Estrogen deficiency 08/27/2015  . Encounter for Medicare annual wellness exam 07/17/2014  . Colon cancer screening 07/17/2014  . Pedal edema 03/09/2014  . Other screening mammogram 11/18/2012  . Immunization counseling 08/02/2012  . 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 SHOULDER PAIN, BILATERAL 12/06/2010  . ALLERGIC RHINITIS 08/23/2010  . Hyperglycemia 08/23/2010  .  Hyperlipidemia 04/10/2007  . Generalized anxiety disorder 04/10/2007  . Smoker 04/10/2007  . DEPRESSION 04/10/2007  . Essential hypertension 04/10/2007  . GERD 04/10/2007  . DEGENERATIVE DISC DISEASE, LUMBOSACRAL SPINE 04/10/2007    Past Surgical History:  Procedure Laterality Date  . CATARACT EXTRACTION      OB History    No data available       Home Medications    Prior to Admission medications   Medication Sig Start Date End Date Taking? Authorizing Provider  acetaminophen (TYLENOL) 500 MG tablet OTC as directed     Historical Provider, MD  albuterol (PROVENTIL HFA;VENTOLIN HFA) 108 (90 Base) MCG/ACT inhaler Inhale 2 puffs into the lungs every 4 (four) hours as needed for wheezing or shortness of breath. 09/22/16   Amy Cletis Athens, MD  aspirin 81 MG tablet Take 81 mg by mouth daily.      Historical Provider, MD  Docusate Calcium (STOOL SOFTENER PO) Take 1 capsule by mouth daily.    Historical Provider, MD  glucose blood test strip To check glucose once daily and as needed for diabetes type 2     Historical Provider, MD  glucose monitoring kit (FREESTYLE) monitoring kit To check glucose once daily and as needed for diabetes (250.0)     Historical Provider, MD  Lancets MISC To check glucose once daily and as needed for diabetes type 2     Historical Provider, MD  lisinopril (PRINIVIL,ZESTRIL) 10 MG tablet TAKE  1 TABLET BY MOUTH DAILY 12/04/16   Abner Greenspan, MD  Multiple Vitamins-Minerals (ONE-A-DAY 50 PLUS PO) Take 1 tablet by mouth daily.    Historical Provider, MD  promethazine (PHENERGAN) 25 MG tablet Take tab daily as needed Patient not taking: Reported on 12/11/2016 12/01/16   Abner Greenspan, MD  ranitidine (ZANTAC) 150 MG tablet Take 1 tablet (150 mg total) by mouth 2 (two) times daily. 05/03/16   Abner Greenspan, MD  simvastatin (ZOCOR) 40 MG tablet TAKE 1 TABLET BY MOUTH EVERY DAY 11/15/16   Abner Greenspan, MD  umeclidinium-vilanterol [facility] ELLIPTA) 62.5-25 MCG/INH AEPB Inhale  1 puff into the lungs daily. 09/22/16   Jinny Sanders, MD  venlafaxine XR (EFFEXOR XR) 150 MG 24 hr capsule Take 1 capsule (150 mg total) by mouth daily with breakfast. 04/20/16   Abner Greenspan, MD    Family History Family History  Problem Relation Age of Onset  . Cancer Mother     breast cancer  . Cancer Sister     breast cancer & lung cancer  . Breast cancer Daughter     Social History Social History  Substance Use Topics  . Smoking status: Current Every Day Smoker    Packs/day: 1.00    Types: Cigarettes  . Smokeless tobacco: Never Used  . Alcohol use No     Allergies   Patient has no known allergies.   Review of Systems Review of Systems  All other systems reviewed and are negative.    Physical Exam Updated Vital Signs BP 133/58 (BP Location: Right Arm)   Pulse 80   Temp 99.2 F (37.3 C) (Rectal)   Resp 24   SpO2 99%   Physical Exam  Constitutional: She is oriented to person, place, and time. She appears lethargic. She appears cachectic.  Non-toxic appearance. No distress.  HENT:  Head: Normocephalic and atraumatic.  Eyes: Conjunctivae, EOM and lids are normal. Pupils are equal, round, and reactive to light.  Neck: Normal range of motion. Neck supple. No tracheal deviation present. No thyroid mass present.  Cardiovascular: Normal rate, regular rhythm and normal heart sounds.  Exam reveals no gallop.   No murmur heard. Pulmonary/Chest: No stridor. Tachypnea noted. She is in respiratory distress. She has no decreased breath sounds. She has wheezes in the right upper field and the left upper field. She has no rhonchi. She has no rales.  Abdominal: Soft. Normal appearance and bowel sounds are normal. She exhibits no distension. There is no tenderness. There is no rebound and no CVA tenderness.  Musculoskeletal: Normal range of motion. She exhibits no edema or tenderness.  Neurological: She is oriented to person, place, and time. She appears lethargic. She displays  atrophy. No cranial nerve deficit or sensory deficit. GCS eye subscore is 4. GCS verbal subscore is 5. GCS motor subscore is 6.  Skin: Skin is warm and dry. No abrasion and no rash noted.  Psychiatric: Her speech is normal. Her affect is blunt.  Nursing note and vitals reviewed.    ED Treatments / Results  Labs (all labs ordered are listed, but only abnormal results are displayed) Labs Reviewed  CULTURE, BLOOD (ROUTINE X 2)  CULTURE, BLOOD (ROUTINE X 2)  CBC WITH DIFFERENTIAL/PLATELET  COMPREHENSIVE METABOLIC PANEL  BRAIN NATRIURETIC PEPTIDE  TROPONIN I  I-STAT CG4 LACTIC ACID, ED    EKG  EKG Interpretation  Date/Time:  Tuesday December 19 2016 16:08:36 EST Ventricular Rate:  100 PR Interval:  QRS Duration: 82 QT Interval:  357 QTC Calculation: 461 R Axis:   -118 Text Interpretation:  Sinus tachycardia Right atrial enlargement Inferior infarct, old Anterior infarct, old Confirmed by August Gosser  MD, Jaydan Meidinger ([id number]) on 11/27/2016 5:19:55 PM       Radiology No results found.  Procedures Procedures (including critical care time)  Medications Ordered in ED Medications  0.9 %  sodium chloride infusion (not administered)  albuterol (PROVENTIL) (2.5 MG/3ML) 0.083% nebulizer solution 5 mg (5 mg Nebulization Given 12/10/2016 1623)     Initial Impression / Assessment and Plan / ED Course  I have reviewed the triage vital signs and the nursing notes.  Pertinent labs & imaging results that were available during my care of the patient were reviewed by me and considered in my medical decision making (see chart for details).  Clinical Course     Patient treated with IV Solu-Medrol as well as albuterol. Chest x-ray consistent with pneumonia. Will place on IV antibiotics and admitted to the medicine service  Final Clinical Impressions(s) / ED Diagnoses   Final diagnoses:  None    New Prescriptions New Prescriptions   No medications on file     Lacretia Leigh, MD 12/15/2016  [phone]

## 2016-12-19 NOTE — ED Notes (Signed)
Pt transported to Xray. 

## 2016-12-19 NOTE — ED Triage Notes (Signed)
Pt from home with her son with complaints of SOB and fall today that are unrelated. Pt has had SOB with exertion that has been increasingly getting worse today. This morning, pt tripped over the corner of a chair and fell hitting her nose. Initially pt had a nosebleed, but there is no epistasis at time of assessment. Fall was witnessed and pt has no complaints secondary to fall at this time. Pt does not wair O2 at home and initially had an oxygen saturation in the 70's. [place] did not improve this, so pt was placed on a nonrebreather. Pt has wheezes in all fields

## 2016-12-19 NOTE — Telephone Encounter (Signed)
Pt's daughter was xfered to me from our [facility], I spoke to daughter Rodena Piety and advise her of lab results and Dr. Marliss Coots comments.  Daughter advised me that pt is on the way to the hospital. Pt has been going down hill and pt's son said the only thing pt ate yesterday was Christmas dinner with everyone and that's it. Today she only ate yogurt and they couldn't get her to eat anything else. Pt also fell twice today, and fell on her face. Son took pt's pulse ox and it was 72%. Pt's daughter is trying to get pt's son to bring her to a [place] hospital because they can see her records from here and labs and xray. But he may take her to a local hospital in [place] where he lives she isn't sure yet, but she has encouraged son to bring pt to a [facility]

## 2016-12-19 NOTE — Addendum Note (Signed)
Addended by: Ellamae Sia on: 12/14/2016 08:42 AM   Modules accepted: Orders

## 2016-12-19 NOTE — Telephone Encounter (Signed)
Previously spoke to daughter regarding scheduling the CT Chest w Contrast, pt may have at hospital,otherwise she will call back to let us know where to schedule.

## 2016-12-19 NOTE — H&P (Signed)
History and Physical    Nancy Herrera:[id number] DOB: 1941-01-24 DOA: 12/03/2016  PCP: [name], MD Consultants:  Scheduled to see pulm on 01/02/17 Patient coming from: living with her son for the last 2 weeks for the holidays; NOK: daughter, Rodena Piety [phone]  Chief Complaint: SOB  HPI: Nancy Herrera is a 75 y.o. female with medical history significant of COPD not on home O2, HTN, HLD, hyperglycemia, and depression/anxiety presenting with SOB.  Patient had been living home alone.  Her son invited her to stay with him for the holidays; he went by to pick her up and she didn't feel well.  The next 3 nights she was up all night with difficulty breathing. The next week she saw Dr. Glori Bickers (12/19) - sodium level was low at 125.  CXR showed possible new mass.  Since then, she has continued to do poorly.  Did not eat well last night.  O2 sats last night and this AM in the 70s.  This AM her heart rate was >100 and O2 sat was 68%.  Does not have home O2.  Also fell today, got up from the table to go to the bathroom, tripped over a chair and fell on her face (mechanical fall).  Short steps before she has to stop to rest.  Has h/o COPD and it has gotten much worse recently.  +cough, nonproductive.  No fever.  Somewhat confused intermittently.  Patient is currently sitting up on bed and tripod sitting, "can't get comfortable."  Some LE edema, mild, over the last few days, improved today.   ED Course: Per Dr. Zenia Resides: Patient treated with IV Solu-Medrol as well as albuterol. Chest x-ray consistent with pneumonia. Will place on IV antibiotics and admitted to the medicine service   Review of Systems: As per HPI; otherwise 10 point review of systems reviewed and negative.   Ambulatory Status:  Usually able to ambulate independently  Past Medical History:  Diagnosis Date  . Allergy    allergic rhinitis  . Anxiety   . COPD (chronic obstructive pulmonary disease) ([place])   . Degenerative disc disease, lumbar    . Depression   . GERD (gastroesophageal reflux disease)   . Hyperglycemia    controlled by diet  . Hyperlipidemia   . Hypertension     Past Surgical History:  Procedure Laterality Date  . CATARACT EXTRACTION      Social History   Social History  . Marital status: Married    Spouse name: N/A  . Number of children: N/A  . Years of education: N/A   Occupational History  . Not on file.   Social History Main Topics  . Smoking status: Current Every Day Smoker    Packs/day: 1.00    Years: 60.00    Types: Cigarettes  . Smokeless tobacco: Never Used  . Alcohol use No  . Drug use: No  . Sexual activity: Not on file   Other Topics Concern  . Not on file   Social History Narrative  . No narrative on file    No Known Allergies  Family History  Problem Relation Age of Onset  . Cancer Mother     breast cancer  . Cancer Sister     breast cancer & lung cancer  . Breast cancer Daughter     Prior to Admission medications   Medication Sig Start Date End Date Taking? Authorizing Provider  acetaminophen (TYLENOL) 500 MG tablet OTC as directed     Historical  Provider, MD  albuterol (PROVENTIL HFA;VENTOLIN HFA) 108 (90 Base) MCG/ACT inhaler Inhale 2 puffs into the lungs every 4 (four) hours as needed for wheezing or shortness of breath. 09/22/16   Amy Cletis Athens, MD  aspirin 81 MG tablet Take 81 mg by mouth daily.      Historical Provider, MD  Docusate Calcium (STOOL SOFTENER PO) Take 1 capsule by mouth daily.    Historical Provider, MD  glucose blood test strip To check glucose once daily and as needed for diabetes type 2     Historical Provider, MD  glucose monitoring kit (FREESTYLE) monitoring kit To check glucose once daily and as needed for diabetes (250.0)     Historical Provider, MD  Lancets MISC To check glucose once daily and as needed for diabetes type 2     Historical Provider, MD  lisinopril (PRINIVIL,ZESTRIL) 10 MG tablet TAKE 1 TABLET BY MOUTH DAILY 12/04/16   Abner Greenspan, MD  Multiple Vitamins-Minerals (ONE-A-DAY 50 PLUS PO) Take 1 tablet by mouth daily.    Historical Provider, MD  promethazine (PHENERGAN) 25 MG tablet Take tab daily as needed Patient not taking: Reported on 12/11/2016 12/01/16   Abner Greenspan, MD  ranitidine (ZANTAC) 150 MG tablet Take 1 tablet (150 mg total) by mouth 2 (two) times daily. 05/03/16   Abner Greenspan, MD  simvastatin (ZOCOR) 40 MG tablet TAKE 1 TABLET BY MOUTH EVERY DAY 11/15/16   Abner Greenspan, MD  umeclidinium-vilanterol [facility] ELLIPTA) 62.5-25 MCG/INH AEPB Inhale 1 puff into the lungs daily. 09/22/16   Jinny Sanders, MD  venlafaxine XR (EFFEXOR XR) 150 MG 24 hr capsule Take 1 capsule (150 mg total) by mouth daily with breakfast. 04/20/16   Abner Greenspan, MD    Physical Exam: Vitals:   12/20/16 0015 12/20/16 0030 12/20/16 0045 12/20/16 0100  BP: 157/68 126/60 137/56 (!) 120/50  Pulse: 113 103 103 100  Resp:      Temp:      TempSrc:      SpO2: 100% 100% 99% 100%     General: Sitting on the end of the bed with facemask O2, looking anxious but not obviously dyspneic Eyes:  PERRL, EOMI, normal lids, iris ENT:  grossly normal hearing, lips & tongue, mmm Neck:  no LAD, masses or thyromegaly Cardiovascular:  Mild tachycardia, no m/r/g. No LE edema.  Respiratory:  Mild tachypnea, moderate air movement, no obvious r/r/c, occasional wheezes Abdomen:  soft, ntnd, NABS Skin:  no rash or induration seen on limited exam Musculoskeletal: grossly normal tone BUE/BLE, good ROM, no bony abnormality Psychiatric:  grossly normal mood and affect, speech fluent and appropriate, AOx3 Neurologic:  CN 2-12 grossly intact, moves all extremities in coordinated fashion, sensation intact  Labs on Admission: I have personally reviewed following labs and imaging studies  CBC:  Recent Labs Lab 11/25/2016 1725  WBC 9.9  NEUTROABS 8.3*  HGB 15.4*  HCT 46.7*  MCV 81.1  PLT 462   Basic Metabolic Panel:  Recent Labs Lab 12/14/16 1324  12/09/2016 2020  NA 126* 127*  K 4.8 3.8  CL 87* 88*  CO2 36* 29  GLUCOSE 120* 166*  BUN 10 11  CREATININE 0.64 0.63  CALCIUM 9.6 8.2*  PHOS 3.8  --    GFR: Estimated Creatinine Clearance: 53.8 mL/min (by C-G formula based on SCr of 0.63 mg/dL). Liver Function Tests:  Recent Labs Lab 12/14/16 1324 11/27/2016 2020  AST  --  28  ALT  --  22  ALKPHOS  --  101  BILITOT  --  0.5  PROT  --  6.7  ALBUMIN 3.8 3.4*   No results for input(s): LIPASE, AMYLASE in the last 168 hours. No results for input(s): AMMONIA in the last 168 hours. Coagulation Profile: No results for input(s): INR, PROTIME in the last 168 hours. Cardiac Enzymes:  Recent Labs Lab 12/17/2016 2020  TROPONINI <0.03   BNP (last 3 results)  Recent Labs  12/11/16 1250  PROBNP 315.0*   HbA1C: No results for input(s): HGBA1C in the last 72 hours. CBG: No results for input(s): GLUCAP in the last 168 hours. Lipid Profile: No results for input(s): CHOL, HDL, LDLCALC, TRIG, CHOLHDL, LDLDIRECT in the last 72 hours. Thyroid Function Tests: No results for input(s): TSH, T4TOTAL, FREET4, T3FREE, THYROIDAB in the last 72 hours. Anemia Panel: No results for input(s): VITAMINB12, FOLATE, FERRITIN, TIBC, IRON, RETICCTPCT in the last 72 hours. Urine analysis:    Component Value Date/Time   BILIRUBINUR Negative 08/30/2016 1144   PROTEINUR Trace 08/30/2016 1144   UROBILINOGEN 0.2 08/30/2016 1144   NITRITE Negative 08/30/2016 1144   LEUKOCYTESUR small (1+) (A) 08/30/2016 1144    Creatinine Clearance: Estimated Creatinine Clearance: 53.8 mL/min (by C-G formula based on SCr of 0.63 mg/dL).  Sepsis Labs: '@LABRCNTIP'$ (procalcitonin:4,lacticidven:4) ) Recent Results (from the past 240 hour(s))  Urine culture     Status: None   Collection Time: 12/14/16  1:24 PM  Result Value Ref Range Status   Organism ID, Bacteria   Final    Multiple organisms present,each less than 10,000 CFU/mL. These organisms,commonly found on  external and internal genitalia,are considered colonizers. No further testing performed.      Radiological Exams on Admission: Dg Chest 2 View  Result Date: 11/27/2016 CLINICAL DATA:  Status post fall. Shortness of breath exertion. Initial encounter. EXAM: CHEST  2 VIEW COMPARISON:  Chest radiograph performed 12/11/2016 FINDINGS: The lungs are well-aerated. Mildly increased central opacities may reflect an atypical infectious process. There is no evidence of pleural effusion or pneumothorax. The heart is normal in size; the mediastinal contour is within normal limits. No acute osseous abnormalities are seen. IMPRESSION: Mildly increased central opacities may reflect an atypical infectious process. No displaced rib fracture seen. Electronically Signed   By: Garald Balding M.D.   On: 11/29/2016 17:13    EKG: Independently reviewed.  Sinus tachycardia with rate 100; nonspecific ST changes with no evidence of acute ischemia  Assessment/Plan Principal Problem:   Acute on chronic respiratory failure with hypoxia (HCC) Active Problems:   Smoker   Hyponatremia   CAP (community acquired pneumonia)   Chronic obstructive pulmonary disease (COPD) (HCC)   Acute on chronic respiratory failure resulting from CAP -Given productive cough, decreased oxygen saturation, and possible opacities on chest x-ray , most likely community-acquired pneumonia.  -Will start Azithromycin 500 mg PO daily x 5 days AND Rocephin. -Also has underlying COPD - may need home O2 chronically but certainly appears to need it now. -Possible new diagnosis of lung cancer - needs Chest CT with contrast, and with her current creatinine it is reasonable to order this; patient is not currently aware of this chest mass due to family request, while pending further evaluation - Repeat CBC in am - Sputum cultures - Blood cultures - albuterol PRN - Duoneb q6h - mucinex -With tripod sitting and overall dyspnea and discomfort, patient is  at risk for needing increased respiratory support.  She is unwilling to be intubated or have  CPR, but would be willing to do a trial of non-invasive ventilatory support if needed.  Will order BIPAP prn for overnight use.  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 -Etiology appears to be euvolemic hyponatremia -Will check urinary and serum Osm (prior serum osmolality was slightly low at 271) as well as urinary sodium and potassium levels -Because the patient is asymptomatic, it is unlikely that this extent of hyponatremia developed overnight; instead this is likely chronic in nature -Will treat with fluid restriction of 1L day -This should be adjusted based on UOP with goal fluid intake of 500 cc less per day than her total urinary output -Will monitor sodium q12h to attempt to avoid overcorrection (>60mq/L/day) so as to avoid central pontine myelinolysis -If fluid restriction is unsuccessful, would need to consider vaptan therapy  Hyperglycemia -Glucose 166 -No known h/o DM -Was given steroids in ER -Will simply monitor with fasting AM labs for now  Tobacco dependence -Encourage cessation.  This was discussed with the patient and should be reviewed on an ongoing basis.   -Patch ordered at patient request.  DVT prophylaxis:  Lovenox Code Status: DNR - confirmed with patient/family Family Communication: Son and daughter present throughout encounter Disposition Plan:  Home once clinically improved Consults called: None for now  Admission status: Admit - It is my clinical opinion that admission to INPATIENT is reasonable and necessary because this patient will require at least 2 midnights in the hospital to treat this condition based on the medical complexity of the problems presented.  Given the aforementioned information, the predictability of an adverse outcome is felt to be significant.    JKarmen BongoMD [facility]  If  7PM-7AM, please contact night-coverage [email or web address] Password [email or web address]  12/20/2016, 1:13 AM

## 2016-12-19 NOTE — Telephone Encounter (Signed)
Pt daughter called regarding labs. Daughter requesting call back

## 2016-12-19 NOTE — ED Notes (Signed)
Bed: WA22 Expected date:  Expected time:  Means of arrival:  Comments: 

## 2016-12-20 ENCOUNTER — Encounter (HOSPITAL_COMMUNITY): Payer: Self-pay | Admitting: Internal Medicine

## 2016-12-20 ENCOUNTER — Inpatient Hospital Stay (HOSPITAL_COMMUNITY): Payer: Medicare Other

## 2016-12-20 DIAGNOSIS — F17200 Nicotine dependence, unspecified, uncomplicated: Secondary | ICD-10-CM

## 2016-12-20 DIAGNOSIS — J189 Pneumonia, unspecified organism: Principal | ICD-10-CM

## 2016-12-20 DIAGNOSIS — J441 Chronic obstructive pulmonary disease with (acute) exacerbation: Secondary | ICD-10-CM

## 2016-12-20 DIAGNOSIS — R918 Other nonspecific abnormal finding of lung field: Secondary | ICD-10-CM

## 2016-12-20 DIAGNOSIS — J449 Chronic obstructive pulmonary disease, unspecified: Secondary | ICD-10-CM | POA: Diagnosis present

## 2016-12-20 DIAGNOSIS — J9621 Acute and chronic respiratory failure with hypoxia: Secondary | ICD-10-CM | POA: Diagnosis present

## 2016-12-20 DIAGNOSIS — E871 Hypo-osmolality and hyponatremia: Secondary | ICD-10-CM

## 2016-12-20 LAB — CBC WITH DIFFERENTIAL/PLATELET
BASOS ABS: 0 10*3/uL (ref 0.0–0.1)
Basophils Relative: 0 %
EOS ABS: 0 10*3/uL (ref 0.0–0.7)
Eosinophils Relative: 0 %
HCT: 43.4 % (ref 36.0–46.0)
Hemoglobin: 13.4 g/dL (ref 12.0–15.0)
Lymphocytes Relative: 4 %
Lymphs Abs: 0.4 10*3/uL — ABNORMAL LOW (ref 0.7–4.0)
MCH: 25.2 pg — AB (ref 26.0–34.0)
MCHC: 30.9 g/dL (ref 30.0–36.0)
MCV: 81.6 fL (ref 78.0–100.0)
Monocytes Absolute: 0.6 10*3/uL (ref 0.1–1.0)
Monocytes Relative: 5 %
Neutro Abs: 11.3 10*3/uL — ABNORMAL HIGH (ref 1.7–7.7)
Neutrophils Relative %: 91 %
PLATELETS: 203 10*3/uL (ref 150–400)
RBC: 5.32 MIL/uL — AB (ref 3.87–5.11)
RDW: 16.8 % — ABNORMAL HIGH (ref 11.5–15.5)
WBC: 12.3 10*3/uL — AB (ref 4.0–10.5)

## 2016-12-20 LAB — BLOOD GAS, ARTERIAL
Acid-Base Excess: 1.2 mmol/L (ref 0.0–2.0)
Bicarbonate: 34.8 mmol/L — ABNORMAL HIGH (ref 20.0–28.0)
Delivery systems: POSITIVE
Expiratory PAP: 6
FIO2: 0.6
Inspiratory PAP: 12
MODE: POSITIVE
O2 SAT: 91.2 %
PATIENT TEMPERATURE: 99
PCO2 ART: 109 mmHg — AB (ref 32.0–48.0)
PH ART: 7.133 — AB (ref 7.350–7.450)
PO2 ART: 78 mmHg — AB (ref 83.0–108.0)
RATE: 12 resp/min

## 2016-12-20 LAB — BASIC METABOLIC PANEL
Anion gap: 9 (ref 5–15)
Anion gap: 11 (ref 5–15)
BUN: 11 mg/dL (ref 6–20)
BUN: 10 mg/dL (ref 6–20)
CO2: 29 mmol/L (ref 22–32)
CO2: 30 mmol/L (ref 22–32)
Calcium: 8.2 mg/dL — ABNORMAL LOW (ref 8.9–10.3)
Calcium: 8.2 mg/dL — ABNORMAL LOW (ref 8.9–10.3)
Chloride: 88 mmol/L — ABNORMAL LOW (ref 101–111)
Chloride: 85 mmol/L — ABNORMAL LOW (ref 101–111)
Creatinine, Ser: 0.6 mg/dL (ref 0.44–1.00)
Creatinine, Ser: 0.55 mg/dL (ref 0.44–1.00)
GFR calc Af Amer: >60 mL/min (ref >60)
GFR calc Af Amer: >60 mL/min (ref >60)
GLUCOSE: 150 mg/dL — AB (ref 65–99)
GLUCOSE: 131 mg/dL — AB (ref 65–99)
POTASSIUM: 4.6 mmol/L (ref 3.5–5.1)
Potassium: 5.2 mmol/L — ABNORMAL HIGH (ref 3.5–5.1)
Sodium: 126 mmol/L — ABNORMAL LOW (ref 135–145)
Sodium: 126 mmol/L — ABNORMAL LOW (ref 135–145)

## 2016-12-20 LAB — NA AND K (SODIUM & POTASSIUM), RAND UR
POTASSIUM UR: 79 mmol/L
SODIUM UR: 56 mmol/L

## 2016-12-20 LAB — OSMOLALITY, URINE: OSMOLALITY UR: 769 mosm/kg (ref 300–900)

## 2016-12-20 LAB — MRSA PCR SCREENING: MRSA by PCR: NEGATIVE

## 2016-12-20 LAB — STREP PNEUMONIAE URINARY ANTIGEN: STREP PNEUMO URINARY ANTIGEN: NEGATIVE

## 2016-12-20 LAB — OSMOLALITY: Osmolality: 270 mOsm/kg — ABNORMAL LOW (ref 275–295)

## 2016-12-20 MED ORDER — MORPHINE SULFATE (PF) 2 MG/ML IV SOLN
2.0000 mg | INTRAVENOUS | Status: DC | PRN
  Administered 2016-12-20 – 2016-12-22 (×6): 2 mg via INTRAVENOUS
  Filled 2016-12-20 (×6): qty 1

## 2016-12-20 MED ORDER — LORAZEPAM 2 MG/ML IJ SOLN
1.0000 mg | Freq: Once | INTRAMUSCULAR | Status: AC
  Administered 2016-12-20: 1 mg via INTRAVENOUS
  Filled 2016-12-20: qty 1

## 2016-12-20 MED ORDER — AZITHROMYCIN 500 MG IV SOLR
500.0000 mg | INTRAVENOUS | Status: DC
  Administered 2016-12-20 – 2016-12-21 (×2): 500 mg via INTRAVENOUS
  Filled 2016-12-20 (×2): qty 500

## 2016-12-20 MED ORDER — BUDESONIDE 0.25 MG/2ML IN SUSP
0.2500 mg | Freq: Two times a day (BID) | RESPIRATORY_TRACT | Status: DC
  Administered 2016-12-20 – 2016-12-23 (×7): 0.25 mg via RESPIRATORY_TRACT
  Filled 2016-12-20 (×8): qty 2

## 2016-12-20 MED ORDER — METHYLPREDNISOLONE SODIUM SUCC 125 MG IJ SOLR
60.0000 mg | Freq: Two times a day (BID) | INTRAMUSCULAR | Status: DC
  Administered 2016-12-20 – 2016-12-22 (×5): 60 mg via INTRAVENOUS
  Filled 2016-12-20 (×5): qty 2

## 2016-12-20 MED ORDER — ALBUTEROL SULFATE (2.5 MG/3ML) 0.083% IN NEBU: 2.5000 mg | INHALATION_SOLUTION | RESPIRATORY_TRACT | Status: DC | PRN

## 2016-12-20 MED ORDER — IOPAMIDOL (ISOVUE-300) INJECTION 61%
75.0000 mL | Freq: Once | INTRAVENOUS | Status: AC | PRN
  Administered 2016-12-20: 75 mL via INTRAVENOUS

## 2016-12-20 MED ORDER — ARFORMOTEROL TARTRATE 15 MCG/2ML IN NEBU
15.0000 ug | INHALATION_SOLUTION | Freq: Two times a day (BID) | RESPIRATORY_TRACT | Status: DC
  Administered 2016-12-20 – 2016-12-23 (×7): 15 ug via RESPIRATORY_TRACT
  Filled 2016-12-20 (×9): qty 2

## 2016-12-20 MED ORDER — IOPAMIDOL (ISOVUE-300) INJECTION 61%
INTRAVENOUS | Status: AC
  Administered 2016-12-20: 75 mL via INTRAVENOUS
  Filled 2016-12-20: qty 75

## 2016-12-20 MED ORDER — IPRATROPIUM-ALBUTEROL 0.5-2.5 (3) MG/3ML IN SOLN
3.0000 mL | Freq: Four times a day (QID) | RESPIRATORY_TRACT | Status: DC
  Administered 2016-12-20 – 2016-12-23 (×14): 3 mL via RESPIRATORY_TRACT
  Filled 2016-12-20 (×16): qty 3

## 2016-12-20 NOTE — Progress Notes (Signed)
[facility] PROGRESS NOTE  Nancy Herrera SAY:[id number] DOB: August 09, 1941 DOA: 12/09/2016 PCP: [name], MD  Interim summary and HPI 75 y.o. female with medical history significant of COPD not on home O2, HTN, HLD, hyperglycemia, and depression/anxiety presenting with SOB.  Patient had been living home alone.  Her son invited her to stay with him for the holidays; he went by to pick her up and she didn't feel well.  The next 3 nights she was up all night with difficulty breathing. The next week she saw Dr. Glori Bickers (12/19) - sodium level was low at 125.  CXR showed possible new mass and CT chest has confirmed suspicious for metastatic lung cancer. Patient admitted for further evaluation and treatment of acute hypoxic failure with PNA and COPD exacerbation.   Assessment/Plan: 1-acute resp failure with hypoxia: patient doing poorly and with acute hypercapnia and hypoxia on ABG. -started on BIPAP -will continue IV antibiotics, solumedrol, Brovana, Pulmicort and nebulizer therapy -PCCM consulted for assistance on her breathing; even patient has specified no intubation. -patient severe resp acidosis  -will continue current treatment and will contact palliative care -PRN ativan to assist with BIPAP tolerance.  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 CT scan positive for metastatic lung cancer; poor prognosis overall. Will consult palliative care.   Consultants:  PCCM  Palliative care  Procedures:  See below for x-ray reports   Antibiotics:  zithromax and rocephin 12/26  HPI/Subjective: Afebrile, in acute resp distress and with increase work of breathing; patient denies CP. Unable to speak in full sentences.  Objective: Vitals:   12/20/16 1700 12/20/16 1800  BP: (!) 135/48 (!) 122/46  Pulse: (!) 102 (!) 105  Resp: (!) 21 (!) 22  Temp:      Intake/Output Summary (Last 24 hours) at 12/20/16 1905 Last data filed at 12/20/16 1800  Gross per 24 hour  Intake          2059.58 ml  Output                0 ml  Net          2059.58 ml   Filed Weights   12/20/16 0720 12/20/16 1057  Weight: 59.4 kg (131 lb) 57.4 kg (126 lb 8.7 oz)    Exam:   General:  Increase work of breathing, somnolent and in resp distress.  Cardiovascular: S1 and S2, no JVD, no rubs or gallops  Respiratory: diffuse exp wheezing, no crackles, positive rhonchi   Abdomen: soft, NT, ND, positive BS  Musculoskeletal: no edema, normal tone  Data Reviewed: Basic Metabolic Panel:  Recent Labs Lab 12/14/16 1324 12/11/2016 2020 12/20/16 0146 12/20/16 1431  NA 126* 127* 126* 126*  K 4.8 3.8 4.6 5.2*  CL 87* 88* 88* 85*  CO2 36* '29 29 30  '$ GLUCOSE 120* 166* 150* 131*  BUN '10 11 11 10  '$ CREATININE 0.64 0.63 0.60 0.55  CALCIUM 9.6 8.2* 8.2* 8.2*  PHOS 3.8  --   --   --    Liver Function Tests:  Recent Labs Lab 12/14/16 1324 12/23/2016 2020  AST  --  28  ALT  --  22  ALKPHOS  --  101  BILITOT  --  0.5  PROT  --  6.7  ALBUMIN 3.8 3.4*   No results for input(s): LIPASE, AMYLASE in the last 168 hours. No results for input(s): AMMONIA in the last 168 hours. CBC:  Recent Labs Lab 12/02/2016 1725 12/20/16 0511  WBC 9.9 12.3*  NEUTROABS 8.3* 11.3*  HGB 15.4* 13.4  HCT 46.7* 43.4  MCV 81.1 81.6  PLT 206 203   Cardiac  Enzymes:  Recent Labs Lab 12/07/2016 2020  TROPONINI <0.03   BNP (last 3 results)  Recent Labs  12/18/2016 1725  BNP 171.9*    ProBNP (last 3 results)  Recent Labs  12/11/16 1250  PROBNP 315.0*    CBG: No results for input(s): GLUCAP in the last 168 hours.  Recent Results (from the past 240 hour(s))  Urine culture     Status: None   Collection Time: 12/14/16  1:24 PM  Result Value Ref Range Status   Organism ID, Bacteria   Final    Multiple organisms present,each less than 10,000 CFU/mL. These organisms,commonly found on external and internal genitalia,are considered colonizers. No further testing performed.   MRSA PCR Screening     Status: None   Collection Time: 12/20/16 10:57 AM  Result Value Ref Range Status   MRSA by PCR NEGATIVE NEGATIVE Final    Comment:        The GeneXpert MRSA Assay (FDA approved for NASAL specimens only), is one component of a comprehensive MRSA colonization surveillance program. It is not intended to diagnose MRSA infection nor to guide or monitor treatment for MRSA infections.      Studies: Dg Chest 2 View  Result Date: 11/29/2016 CLINICAL DATA:  Status post fall. Shortness of breath exertion. Initial encounter. EXAM: CHEST  2 VIEW COMPARISON:  Chest radiograph performed 12/11/2016 FINDINGS: The lungs are well-aerated. Mildly increased central opacities may reflect an atypical infectious process. There is no evidence of pleural effusion or pneumothorax. The heart is normal in size; the mediastinal contour is within normal limits. No acute osseous abnormalities are seen. IMPRESSION: Mildly increased central opacities may reflect an atypical infectious process. No displaced rib fracture seen. Electronically Signed   By: Garald Balding M.D.   On: 11/29/2016 17:13   Ct Chest W Contrast  Result Date: 12/20/2016 CLINICAL DATA:  Dyspnea and difficulty breathing for 3 days. Nonproductive cough. Evaluate for lung mass. EXAM: CT CHEST  WITH CONTRAST TECHNIQUE: Multidetector CT imaging of the chest was performed during intravenous contrast administration. CONTRAST:  75 cc of Isovue-300 IV COMPARISON:  None. FINDINGS: Cardiovascular: Aneurysmal dilatation of the ascending aorta to 4.4 cm at the level of the main pulmonary artery. The main pulmonary artery is also dilated to 3.9 cm consistent with pulmonary hypertension. The visualized cardiac chambers are normal in size. There is coronary arteriosclerosis along the left main and LAD. No pericardial effusion. No large central pulmonary embolus. Mediastinum/Nodes: Subcentimeter supraclavicular, axillary and mediastinal lymph nodes are noted the largest approximately 8 mm short axis in the upper paratracheal portion of the mediastinum and AP window. Lungs/Pleura: Multiple bilateral spiculated pulmonary masses are noted, the largest on the right is seen in the right upper lobe anteriorly measuring 2.2 x 1.7 cm on series 4, image 26 and  on the left in the anterior left upper lobe slightly bilobed in appearance measuring 2 x 1.3 cm, series 4, image 70. This is in the setting of centrilobular emphysema. Small to moderate right pleural effusion. Right middle lobe atelectasis and bronchiectasis is noted. Upper Abdomen: Numerous 14 mm or less hypodense lesions of the liver are identified, the largest in the caudate with fluid attenuation consistent with a cyst. The others are too small to further characterize and given the spiculated pulmonary masses should be monitored to exclude hepatic metastasis. 2 cm left adrenal mass suspicious for metastasis. Right adrenal gland is unremarkable. Musculoskeletal: Osteoblastic metastatic disease noted of the manubrium, T5 and T12 vertebral bodies with mixed osteolytic and osteoblastic appearing lesion of T6 and an osteolytic T7 lesions are noted. Additional osteolytic abnormalities of the right humeral head, scapula, bilateral ribs and T7 lamina. IMPRESSION: Numerous  bilateral spiculated pulmonary masses with metastatic disease to bone and possibly the liver and left adrenal gland. Small to moderate right pleural effusion. 4.4 cm ascending aortic aneurysm with central pulmonary hypertension characterized by engorgement of the main pulmonary artery as well. Recommend semi-annual imaging followup by CTA or MRA and referral to cardiothoracic surgery if not already obtained. This recommendation follows 2010 ACCF/AHA/AATS/ACR/ASA/SCA/SCAI/SIR/STS/SVM Guidelines for the Diagnosis and Management of Patients With Thoracic Aortic Disease. Circulation. 2010; 121: [id number] Electronically Signed   By: Ashley Royalty M.D.   On: 12/20/2016 03:07    Scheduled Meds: . arformoterol  15 mcg Nebulization BID  . aspirin EC  81 mg Oral Daily  . azithromycin  500 mg Intravenous Q24H  . budesonide (PULMICORT) nebulizer solution  0.25 mg Nebulization BID  . cefTRIAXone (ROCEPHIN)  IV  1 g Intravenous Q24H  . enoxaparin (LOVENOX) injection  40 mg Subcutaneous QHS  . famotidine  20 mg Oral BID  . ipratropium-albuterol  3 mL Nebulization Q6H  . lisinopril  10 mg Oral Daily  . methylPREDNISolone (SOLU-MEDROL) injection  60 mg Intravenous Q12H  . nicotine  21 mg Transdermal Daily  . simvastatin  40 mg Oral Daily  . venlafaxine XR  150 mg Oral Q breakfast   Continuous Infusions: . lactated ringers 75 mL/hr at 12/20/16 1045    Principal Problem:   Acute on chronic respiratory failure with hypoxia (HCC) Active Problems:   Smoker   Hyponatremia   CAP (community acquired pneumonia)   Chronic obstructive pulmonary disease (COPD) ([place])    Time spent: 35 minutes    Barton Dubois  [facility] Pager [phone]. If 7PM-7AM, please contact night-coverage at [email or web address], password [facility] 12/20/2016, 7:05 PM  LOS: 1 day

## 2016-12-20 NOTE — ED Notes (Signed)
Contacted hospitalist about patient's mental status change and wheezing.

## 2016-12-20 NOTE — Consult Note (Signed)
PULMONARY / CRITICAL CARE MEDICINE   Name: Nancy Herrera MRN: [id number] DOB: [birth year]    ADMISSION DATE:  12/13/2016 CONSULTATION DATE:  12/20/16  REFERRING MD:  Reino Bellis MD  CHIEF COMPLAINT:  Hypercarbic respiratory failure  HISTORY OF PRESENT ILLNESS:   75 year old with history of COPD, depression, GERD, hypertension, hyperlipidemia, active smoker. Admitted on pulse/26 with progressive dyspnea. Admission labs noted for a sodium of 125, chest x-ray with lung mass. She has been admitted to the stepdown unit on BiPAP with minimal improvement in PCO2. She had a CT scan of the chest which shows numerous spiculated pulmonary masses with bone, liver, left adrenal lesions suspicious for metastatic cancer  PAST MEDICAL HISTORY :  She  has a past medical history of Allergy; Anxiety; COPD (chronic obstructive pulmonary disease) ([place]); Degenerative disc disease, lumbar; Depression; GERD (gastroesophageal reflux disease); Hyperglycemia; Hyperlipidemia; and Hypertension.  PAST SURGICAL HISTORY: She  has a past surgical history that includes Cataract extraction.  No Known Allergies  No current facility-administered medications on file prior to encounter.    Current Outpatient Prescriptions on File Prior to Encounter  Medication Sig  . acetaminophen (TYLENOL) 500 MG tablet Take 1,000 mg by mouth every 4 (four) hours as needed for moderate pain. OTC as directed   . albuterol (PROVENTIL HFA;VENTOLIN HFA) 108 (90 Base) MCG/ACT inhaler Inhale 2 puffs into the lungs every 4 (four) hours as needed for wheezing or shortness of breath.  Marland Kitchen aspirin 81 MG tablet Take 81 mg by mouth daily.    Mariane Baumgarten Calcium (STOOL SOFTENER PO) Take 1 capsule by mouth every evening.   Marland Kitchen lisinopril (PRINIVIL,ZESTRIL) 10 MG tablet TAKE 1 TABLET BY MOUTH DAILY  . Multiple Vitamins-Minerals (ONE-A-DAY 50 PLUS PO) Take 1 tablet by mouth daily.  . ranitidine (ZANTAC) 150 MG tablet Take 1 tablet (150 mg total) by mouth 2 (two)  times daily.  . simvastatin (ZOCOR) 40 MG tablet TAKE 1 TABLET BY MOUTH EVERY DAY  . umeclidinium-vilanterol (ANORO ELLIPTA) 62.5-25 MCG/INH AEPB Inhale 1 puff into the lungs daily.  Marland Kitchen venlafaxine XR (EFFEXOR XR) 150 MG 24 hr capsule Take 1 capsule (150 mg total) by mouth daily with breakfast.    FAMILY HISTORY:  Her indicated that the status of her mother is unknown. She indicated that the status of her sister is unknown. She indicated that the status of her daughter is unknown.    SOCIAL HISTORY: She  reports that she has been smoking Cigarettes.  She has a 60.00 pack-year smoking history. She has never used smokeless tobacco. She reports that she does not drink alcohol or use drugs.  REVIEW OF SYSTEMS:   Unable to obtain due to altered mental status  SUBJECTIVE:    VITAL SIGNS: BP (!) 134/41   Pulse (!) 101   Temp 99.3 F (37.4 C) (Axillary)   Resp (!) 22   Ht '5\' 5"'$  (1.651 m)   Wt 126 lb 8.7 oz (57.4 kg)   SpO2 93%   BMI 21.06 kg/m   HEMODYNAMICS:    VENTILATOR SETTINGS: Vent Mode: PCV;BIPAP FiO2 (%):  [40 %-100 %] 50 % Set Rate:  [15 bmp] 15 bmp PEEP:  [6 cmH20-8 cmH20] 8 cmH20 Pressure Support:  [12 cmH20] 12 cmH20  INTAKE / OUTPUT: I/O last 3 completed shifts: In: 353.3 [I.V.:53.3; IV Piggyback:300] Out: -   PHYSICAL EXAMINATION: General:  Somnolent, on bipap, barely arousable Neuro:  No focal deficits HEENT:  Moist mucus membranes, No thryomegaly, JVD Cardiovascular:  RRR,  No MRG Lungs:  B/L Rhonchi, scattered wheeze Abdomen:  Soft, + BS Musculoskeletal:  Normal tone and bulk Skin:  Intact  LABS:  BMET  Recent Labs Lab 11/29/2016 2020 12/20/16 0146 12/20/16 1431  NA 127* 126* 126*  K 3.8 4.6 5.2*  CL 88* 88* 85*  CO2 '29 29 30  '$ BUN '11 11 10  '$ CREATININE 0.63 0.60 0.55  GLUCOSE 166* 150* 131*    Electrolytes  Recent Labs Lab 12/14/16 1324 12/08/2016 2020 12/20/16 0146 12/20/16 1431  CALCIUM 9.6 8.2* 8.2* 8.2*  PHOS 3.8  --   --   --      CBC  Recent Labs Lab 12/10/2016 1725 12/20/16 0511  WBC 9.9 12.3*  HGB 15.4* 13.4  HCT 46.7* 43.4  PLT 206 203    Coag's No results for input(s): APTT, INR in the last 168 hours.  Sepsis Markers  Recent Labs Lab 12/12/2016 1734  LATICACIDVEN 1.33    ABG  Recent Labs Lab 12/20/16 0903 12/20/16 1425  PHART 7.133* 7.207*  PCO2ART 109* 93.4*  PO2ART 78.0* 76.6*    Liver Enzymes  Recent Labs Lab 12/14/16 1324 11/24/2016 2020  AST  --  28  ALT  --  22  ALKPHOS  --  101  BILITOT  --  0.5  ALBUMIN 3.8 3.4*    Cardiac Enzymes  Recent Labs Lab 12/09/2016 2020  TROPONINI <0.03    Glucose No results for input(s): GLUCAP in the last 168 hours.  Imaging Ct Chest W Contrast  Result Date: 12/20/2016 CLINICAL DATA:  Dyspnea and difficulty breathing for 3 days. Nonproductive cough. Evaluate for lung mass. EXAM: CT CHEST WITH CONTRAST TECHNIQUE: Multidetector CT imaging of the chest was performed during intravenous contrast administration. CONTRAST:  75 cc of Isovue-300 IV COMPARISON:  None. FINDINGS: Cardiovascular: Aneurysmal dilatation of the ascending aorta to 4.4 cm at the level of the main pulmonary artery. The main pulmonary artery is also dilated to 3.9 cm consistent with pulmonary hypertension. The visualized cardiac chambers are normal in size. There is coronary arteriosclerosis along the left main and LAD. No pericardial effusion. No large central pulmonary embolus. Mediastinum/Nodes: Subcentimeter supraclavicular, axillary and mediastinal lymph nodes are noted the largest approximately 8 mm short axis in the upper paratracheal portion of the mediastinum and AP window. Lungs/Pleura: Multiple bilateral spiculated pulmonary masses are noted, the largest on the right is seen in the right upper lobe anteriorly measuring 2.2 x 1.7 cm on series 4, image 26 and on the left in the anterior left upper lobe slightly bilobed in appearance measuring 2 x 1.3 cm, series 4, image  70. This is in the setting of centrilobular emphysema. Small to moderate right pleural effusion. Right middle lobe atelectasis and bronchiectasis is noted. Upper Abdomen: Numerous 14 mm or less hypodense lesions of the liver are identified, the largest in the caudate with fluid attenuation consistent with a cyst. The others are too small to further characterize and given the spiculated pulmonary masses should be monitored to exclude hepatic metastasis. 2 cm left adrenal mass suspicious for metastasis. Right adrenal gland is unremarkable. Musculoskeletal: Osteoblastic metastatic disease noted of the manubrium, T5 and T12 vertebral bodies with mixed osteolytic and osteoblastic appearing lesion of T6 and an osteolytic T7 lesions are noted. Additional osteolytic abnormalities of the right humeral head, scapula, bilateral ribs and T7 lamina. IMPRESSION: Numerous bilateral spiculated pulmonary masses with metastatic disease to bone and possibly the liver and left adrenal gland. Small to moderate right pleural effusion.  4.4 cm ascending aortic aneurysm with central pulmonary hypertension characterized by engorgement of the main pulmonary artery as well. Recommend semi-annual imaging followup by CTA or MRA and referral to cardiothoracic surgery if not already obtained. This recommendation follows 2010 ACCF/AHA/AATS/ACR/ASA/SCA/SCAI/SIR/STS/SVM Guidelines for the Diagnosis and Management of Patients With Thoracic Aortic Disease. Circulation. 2010; 121: [phone] Electronically Signed   By: Ashley Royalty M.D.   On: 12/20/2016 03:07     STUDIES:  CT scan 12/11/2016-  numerous bilateral hospital related pulmonary masses with bone, liver, left adrenal lesions, small to moderate right effusion. 4.4 cm ascending aortic aneurysm with enlarged PA. Images reviewed.  CULTURES: Bcx 12/26- NGTD  ANTIBIOTICS: Ceftriaxone 12/27 > Azithro 12/27 >  SIGNIFICANT EVENTS:  LINES/TUBES:  DISCUSSION: 75 year old with COPD admitted  with acute on chronic hypercarbic respiratory failure, hyponatremia concerning for SIADH from lung cancer.   I had a long discussion with Mrs. Abts sons and family. She is being treated maximally with antibiotics, nebs, steroids and BiPAP. They told me that the patient had expressed her wishes clearly to be DNR/DNI. I informed them that the lung maging is concerning for metastatic lung cancer. They're aware that even with a diagnosis of malignancy she may not be a candidate for agressive therapy. They want to continue treating with noninvasive measures and focusing on comfort. If she should deteriorate in spite of medical treatment then they would be okay with transitioning to comfort care. We can consider asking IR to assess the rt effusion for a tap  - Continue ceftriaxone, azithromycin - Bipap support. - Steroids solumedrol 60 mg IV q12. Garlon Hatchet, duonebs. - Consider palliative care consult. Continued goals of care discussion with the family.  PCCM will be available as needed. Please call with any questions.  Marshell Garfinkel MD [facility] Pager [phone] If no answer or after 3pm call: [phone] 12/20/2016, 7:10 PM

## 2016-12-20 NOTE — Progress Notes (Signed)
CRITICAL VALUE ALERT  Critical value received:  9244  Date of notification: 12/21/2015  Time of notification:  6286  Critical value read back:Yes.    Nurse who received alert:  [place]  MD notified (1st page):  Dr. Dyann Kief  Time of first page:  1430  MD notified (2nd page):  Time of second page:  Responding MD: Dr. Dyann Kief Time MD responded: [phone]

## 2016-12-21 ENCOUNTER — Inpatient Hospital Stay (HOSPITAL_COMMUNITY): Payer: Medicare Other

## 2016-12-21 DIAGNOSIS — C3490 Malignant neoplasm of unspecified part of unspecified bronchus or lung: Secondary | ICD-10-CM

## 2016-12-21 LAB — BLOOD GAS, ARTERIAL
Acid-Base Excess: 8.9 mmol/L — ABNORMAL HIGH (ref 0.0–2.0)
Bicarbonate: 40.5 mmol/L — ABNORMAL HIGH (ref 20.0–28.0)
DRAWN BY: 257701
FIO2: 50
LHR: 12 {breaths}/min
O2 SAT: 95.6 %
PATIENT TEMPERATURE: 98.6
PCO2 ART: 98.9 mmHg — AB (ref 32.0–48.0)
PEEP: 8 cmH2O
PH ART: 7.236 — AB (ref 7.350–7.450)
Pressure control: 8 cmH2O
pO2, Arterial: 84.6 mmHg (ref 83.0–108.0)

## 2016-12-21 LAB — BASIC METABOLIC PANEL
ANION GAP: 8 (ref 5–15)
ANION GAP: 5 (ref 5–15)
BUN: 12 mg/dL (ref 6–20)
BUN: 16 mg/dL (ref 6–20)
CHLORIDE: 87 mmol/L — AB (ref 101–111)
CHLORIDE: 87 mmol/L — AB (ref 101–111)
CO2: 34 mmol/L — AB (ref 22–32)
CO2: 38 mmol/L — AB (ref 22–32)
Calcium: 8.3 mg/dL — ABNORMAL LOW (ref 8.9–10.3)
Calcium: 8.4 mg/dL — ABNORMAL LOW (ref 8.9–10.3)
Creatinine, Ser: 0.42 mg/dL — ABNORMAL LOW (ref 0.44–1.00)
Creatinine, Ser: 0.54 mg/dL (ref 0.44–1.00)
GFR calc Af Amer: >60 mL/min (ref >60)
GFR calc Af Amer: >60 mL/min (ref >60)
GFR calc non Af Amer: >60 mL/min (ref >60)
GLUCOSE: 119 mg/dL — AB (ref 65–99)
GLUCOSE: 126 mg/dL — AB (ref 65–99)
POTASSIUM: 5 mmol/L (ref 3.5–5.1)
POTASSIUM: 5.1 mmol/L (ref 3.5–5.1)
Sodium: 129 mmol/L — ABNORMAL LOW (ref 135–145)
Sodium: 130 mmol/L — ABNORMAL LOW (ref 135–145)

## 2016-12-21 NOTE — Telephone Encounter (Signed)
Per linda t this was done at hospital

## 2016-12-21 NOTE — Progress Notes (Signed)
   12/21/16 1000  Clinical Encounter Type  Visited With Patient and family together  Visit Type Psychological support;Spiritual support;Critical Care;Follow-up  Referral From Nurse  Consult/Referral To Chaplain  Spiritual Encounters  Spiritual Needs Emotional;Other (Comment) (Pastoral Conversation/Support)  Stress Factors  Patient Stress Factors Not reviewed  Family Stress Factors Health changes;Major life changes   I visited briefly with Nancy Herrera and her husband. The respiratory therapist was in the room at the time of my visit. The patient's husband was visibly upset, but stated that they were fine and did not need Spiritual Care.   Please, contact Spiritual Care for further assistance.   [place] M.Div.

## 2016-12-21 NOTE — Progress Notes (Signed)
No pleural fluid noted around either lung on [place].  No Thoracentesis performed.  Niah Heinle E

## 2016-12-21 NOTE — Progress Notes (Signed)
[facility] PROGRESS NOTE  Nancy Herrera ZLD:[id number] DOB: 13-Dec-1941 DOA: 12/13/2016 PCP: [name], MD  Interim summary and HPI 75 y.o. female with medical history significant of COPD not on home O2, HTN, HLD, hyperglycemia, and depression/anxiety presenting with SOB.  Patient had been living home alone.  Her son invited her to stay with him for the holidays; he went by to pick her up and she didn't feel well.  The next 3 nights she was up all night with difficulty breathing. The next week she saw Dr. Glori Bickers (12/19) - sodium level was low at 125.  CXR showed possible new mass and CT chest has confirmed suspicious for metastatic lung cancer. Patient admitted for further evaluation and treatment of acute hypoxic failure with PNA and COPD exacerbation.   Assessment/Plan: 1-acute resp failure with hypoxia: patient doing poorly and with acute hypercapnia and hypoxia on ABG. -continue on BIPAP and weaned as tolerated  -will continue IV antibiotics, solumedrol, Brovana, Pulmicort and nebulizer therapy -PCCM consulted for assistance on her breathing; even patient has specified no intubation. -patient with severe resp acidosis  -will continue current treatment and will contact palliative care -PRN ativan and morphine to assist with BIPAP tolerance.  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 CT scan positive for metastatic lung cancer; poor prognosis overall. Will consult palliative care.   Consultants:  PCCM  Palliative care  IR  Procedures:  See below for x-ray reports   Antibiotics:  zithromax and rocephin 12/26  HPI/Subjective: Afebrile, somnolent and still with increase work of breathing and requiring BIPAP.  Objective: Vitals:   12/21/16 1100 12/21/16 1200  BP: (!) 167/65 (!) 143/44  Pulse: (!) 119 (!) 109  Resp: (!) 21 15  Temp:  97.6 F (36.4 C)    Intake/Output Summary (Last 24 hours) at 12/21/16 1327 Last data filed at 12/21/16 0500  Gross per 24 hour  Intake             1450 ml  Output                0 ml  Net             1450 ml   Filed Weights   12/20/16 0720 12/20/16 1057  Weight: 59.4 kg (131 lb) 57.4 kg (126 lb 8.7 oz)    Exam:   General:  Still with resp distress and requiring BIPAP, even slightly improved. Somnolent, even easily arouse and answering questions. Denies CP.   Cardiovascular: S1 and S2, no JVD, no rubs or gallops  Respiratory: diffuse exp wheezing, no crackles, positive rhonchi   Abdomen: soft, NT, ND, positive BS  Musculoskeletal: no edema, normal tone  Data Reviewed: Basic Metabolic Panel:  Recent Labs Lab 12/11/2016 2020 12/20/16 0146  12/20/16 1431 12/21/16 0114  NA 127* 126* 126* 129*  K 3.8 4.6 5.2* 5.0  CL 88* 88* 85* 87*  CO2 '29 29 30 '$ 34*  GLUCOSE 166* 150* 131* 119*  BUN '11 11 10 12  '$ CREATININE 0.63 0.60 0.55 0.42*  CALCIUM 8.2* 8.2* 8.2* 8.3*   Liver Function Tests:  Recent Labs Lab 12/23/2016 2020  AST 28  ALT 22  ALKPHOS 101  BILITOT 0.5  PROT 6.7  ALBUMIN 3.4*   CBC:  Recent Labs Lab 12/08/2016 1725 12/20/16 0511   WBC 9.9 12.3*  NEUTROABS 8.3* 11.3*  HGB 15.4* 13.4  HCT 46.7* 43.4  MCV 81.1 81.6  PLT 206 203   Cardiac Enzymes:  Recent Labs Lab 12/01/2016 2020  TROPONINI <0.03   BNP (last 3 results)  Recent Labs  12/22/2016 1725  BNP 171.9*    ProBNP (last 3 results)  Recent Labs  12/11/16 1250  PROBNP 315.0*    CBG: No results for input(s): GLUCAP in the last 168 hours.  Recent Results (from the past 240 hour(s))  Urine culture     Status: None   Collection Time: 12/14/16  1:24 PM  Result Value Ref Range Status   Organism ID, Bacteria   Final    Multiple organisms present,each less than 10,000 CFU/mL. These organisms,commonly found on external and internal genitalia,are considered colonizers. No further testing performed.   Culture, blood (Routine X 2) w Reflex to ID Panel     Status: None (Preliminary result)   Collection Time: 12/16/2016  5:25 PM  Result Value Ref Range Status   Specimen Description RIGHT ANTECUBITAL  Final   Special Requests IN PEDIATRIC BOTTLE 5CC  Final   Culture   Final    NO GROWTH 1 DAY Performed at [facility]    Report Status PENDING  Incomplete  Culture, blood (Routine X 2) w Reflex to ID Panel     Status: None (Preliminary result)   Collection Time: 12/08/2016  6:24 PM  Result Value Ref Range Status   Specimen Description BLOOD RIGHT ARM  Final   Special Requests BOTTLES DRAWN AEROBIC AND ANAEROBIC 5CC  Final   Culture   Final    NO GROWTH 1 DAY Performed at [facility]    Report Status PENDING  Incomplete  MRSA PCR Screening     Status: None   Collection Time: 12/20/16 10:57 AM  Result Value Ref Range Status   MRSA by PCR NEGATIVE NEGATIVE Final    Comment:        The GeneXpert MRSA Assay (FDA approved for NASAL specimens only), is one component of a comprehensive MRSA colonization surveillance program. It is not intended to diagnose MRSA infection nor to guide or monitor treatment for MRSA infections.       Studies: Dg Chest 2 View  Result Date: 12/13/2016 CLINICAL DATA:  Status post fall. Shortness of breath exertion. Initial encounter. EXAM: CHEST  2 VIEW COMPARISON:  Chest radiograph performed 12/11/2016 FINDINGS: The lungs are well-aerated. Mildly increased central opacities may reflect an atypical infectious process. There is no evidence of pleural effusion or pneumothorax. The heart is normal in size; the mediastinal contour is within normal limits. No acute osseous abnormalities are seen. IMPRESSION: Mildly increased central opacities may reflect an atypical infectious process. No displaced rib fracture seen. Electronically Signed   By: Garald Balding M.D.   On: 11/27/2016 17:13   Ct Chest W Contrast  Result Date: 12/20/2016 CLINICAL DATA:  Dyspnea  and difficulty breathing for 3 days. Nonproductive cough. Evaluate for lung mass. EXAM: CT CHEST WITH CONTRAST TECHNIQUE: Multidetector CT imaging of the chest was performed during intravenous contrast administration. CONTRAST:  75 cc of Isovue-300 IV COMPARISON:  None. FINDINGS: Cardiovascular: Aneurysmal dilatation of the ascending aorta to 4.4 cm at the level of the main pulmonary artery. The main pulmonary artery is also dilated to 3.9 cm consistent with pulmonary hypertension. The visualized cardiac chambers are normal in size. There is coronary arteriosclerosis along the left main and LAD. No pericardial effusion. No large central pulmonary embolus. Mediastinum/Nodes: Subcentimeter supraclavicular, axillary and mediastinal lymph nodes are noted the largest approximately 8 mm short axis in the upper paratracheal portion of the mediastinum and AP window. Lungs/Pleura: Multiple bilateral spiculated pulmonary masses are noted, the largest on the right is seen in the right upper lobe anteriorly measuring 2.2 x 1.7 cm on series 4, image 26 and on the left in the anterior left upper lobe slightly bilobed in appearance measuring 2 x 1.3 cm, series 4, image  70. This is in the setting of centrilobular emphysema. Small to moderate right pleural effusion. Right middle lobe atelectasis and bronchiectasis is noted. Upper Abdomen: Numerous 14 mm or less hypodense lesions of the liver are identified, the largest in the caudate with fluid attenuation consistent with a cyst. The others are too small to further characterize and given the spiculated pulmonary masses should be monitored to exclude hepatic metastasis. 2 cm left adrenal mass suspicious for metastasis. Right adrenal gland is unremarkable. Musculoskeletal: Osteoblastic metastatic disease noted of the manubrium, T5 and T12 vertebral bodies with mixed osteolytic and osteoblastic appearing lesion of T6 and an osteolytic T7 lesions are noted. Additional osteolytic abnormalities of the right humeral head, scapula, bilateral ribs and T7 lamina. IMPRESSION: Numerous bilateral spiculated pulmonary masses with metastatic disease to bone and possibly the liver and left adrenal gland. Small to moderate right pleural effusion. 4.4 cm ascending aortic aneurysm with central pulmonary hypertension characterized by engorgement of the main pulmonary artery as well. Recommend semi-annual imaging followup by CTA or MRA and referral to cardiothoracic surgery if not already obtained. This recommendation follows 2010 ACCF/AHA/AATS/ACR/ASA/SCA/SCAI/SIR/STS/SVM Guidelines for the Diagnosis and Management of Patients With Thoracic Aortic Disease. Circulation. 2010; 121: [phone] Electronically Signed   By: Ashley Royalty M.D.   On: 12/20/2016 03:07    Scheduled Meds: . arformoterol  15 mcg Nebulization BID  . aspirin EC  81 mg Oral Daily  . azithromycin  500 mg Intravenous Q24H  . budesonide (PULMICORT) nebulizer solution  0.25 mg Nebulization BID  . cefTRIAXone (ROCEPHIN)  IV  1 g Intravenous Q24H  . enoxaparin (LOVENOX) injection  40 mg Subcutaneous QHS  . famotidine  20 mg Oral BID  . ipratropium-albuterol  3 mL Nebulization Q6H   . lisinopril  10 mg Oral Daily  . methylPREDNISolone (SOLU-MEDROL) injection  60 mg Intravenous Q12H  . nicotine  21 mg Transdermal Daily  . simvastatin  40 mg Oral Daily  . venlafaxine XR  150 mg Oral Q breakfast   Continuous Infusions: . lactated ringers 75 mL/hr at 12/20/16 1045    Principal Problem:   Acute on chronic respiratory failure with hypoxia (HCC) Active Problems:   Smoker   Hyponatremia   CAP (community acquired pneumonia)   Chronic obstructive pulmonary disease (COPD) ([place])    Time spent: 35 minutes    Barton Dubois  [facility] Pager [phone]. If 7PM-7AM, please contact night-coverage at [email or web address], password [facility]  12/21/2016, 1:27 PM  LOS: 2 days

## 2016-12-21 NOTE — Progress Notes (Signed)
No charge note  Palliative consult request received Patient seen Resting on BIPAP, not very alert Labs, ABG and imaging reviewed Discussed with Dr Dyann Kief Discussed with son in the room, patient has 6 kids, son wishes to call in all of his siblings for a family meeting tomorrow morning. He has been given our palliative card.   Await call back from son for time of tomorrow's family meeting Full note and further recommendations to follow  Loistine Chance MD [facility] health palliative medicine team [phone]

## 2016-12-22 DIAGNOSIS — C3490 Malignant neoplasm of unspecified part of unspecified bronchus or lung: Secondary | ICD-10-CM

## 2016-12-22 DIAGNOSIS — E874 Mixed disorder of acid-base balance: Secondary | ICD-10-CM

## 2016-12-22 LAB — BASIC METABOLIC PANEL
ANION GAP: 6 (ref 5–15)
BUN: 18 mg/dL (ref 6–20)
CHLORIDE: 86 mmol/L — AB (ref 101–111)
CO2: 40 mmol/L — ABNORMAL HIGH (ref 22–32)
Calcium: 8.2 mg/dL — ABNORMAL LOW (ref 8.9–10.3)
Creatinine, Ser: 0.46 mg/dL (ref 0.44–1.00)
GFR calc non Af Amer: >60 mL/min (ref >60)
Glucose, Bld: 128 mg/dL — ABNORMAL HIGH (ref 65–99)
POTASSIUM: 5 mmol/L (ref 3.5–5.1)
SODIUM: 132 mmol/L — AB (ref 135–145)

## 2016-12-22 LAB — BLOOD GAS, ARTERIAL
Acid-Base Excess: 10.7 mmol/L — ABNORMAL HIGH (ref 0.0–2.0)
Bicarbonate: 42.9 mmol/L — ABNORMAL HIGH (ref 20.0–28.0)
DRAWN BY: 257701
FIO2: 50
O2 Saturation: 96.6 %
PATIENT TEMPERATURE: 98.6
PEEP: 8 cmH2O
PO2 ART: 92.3 mmHg (ref 83.0–108.0)
Pressure control: 8 cmH2O
RATE: 12 resp/min
pCO2 arterial: 104 mmHg (ref 32.0–48.0)
pH, Arterial: 7.238 — ABNORMAL LOW (ref 7.350–7.450)

## 2016-12-22 MED ORDER — LORAZEPAM 2 MG/ML IJ SOLN
1.0000 mg | INTRAMUSCULAR | Status: DC | PRN
  Administered 2016-12-22 – 2016-12-23 (×3): 1 mg via INTRAVENOUS
  Filled 2016-12-22 (×3): qty 1

## 2016-12-22 MED ORDER — LORAZEPAM 2 MG/ML IJ SOLN
0.5000 mg | Freq: Once | INTRAMUSCULAR | Status: AC
  Administered 2016-12-22: 0.5 mg via INTRAVENOUS
  Filled 2016-12-22: qty 1

## 2016-12-22 MED ORDER — HYDRALAZINE HCL 20 MG/ML IJ SOLN: 10.0000 mg | Freq: Three times a day (TID) | INTRAMUSCULAR | Status: DC | PRN

## 2016-12-22 MED ORDER — MORPHINE SULFATE (PF) 2 MG/ML IV SOLN
2.0000 mg | INTRAVENOUS | Status: DC | PRN
  Administered 2016-12-22 – 2016-12-23 (×3): 2 mg via INTRAVENOUS
  Filled 2016-12-22 (×3): qty 1

## 2016-12-22 MED ORDER — FAMOTIDINE IN NACL 20-0.9 MG/50ML-% IV SOLN
20.0000 mg | INTRAVENOUS | Status: DC
  Administered 2016-12-22: 20 mg via INTRAVENOUS
  Filled 2016-12-22 (×2): qty 50

## 2016-12-22 MED ORDER — METHYLPREDNISOLONE SODIUM SUCC 125 MG IJ SOLR: 60.0000 mg | Freq: Every day | INTRAMUSCULAR | Status: DC

## 2016-12-22 NOTE — Progress Notes (Signed)
Pt remains on BIPAP/NIV at this time.

## 2016-12-22 NOTE — Consult Note (Signed)
Consultation Note Date: 12/22/2016   Patient Name: Nancy Herrera  DOB: Dec 10, 1941  MRN: [id number]  Age / Sex: 75 y.o., female  PCP: Abner Greenspan, MD Referring Physician: Barton Dubois, MD;Jennif*  Reason for Consultation: Establishing goals of care  HPI/Patient Profile: 75 y.o. female   admitted on 12/11/2016   75 y.o.femalewith medical history significant of COPD not on home O2, HTN, HLD, hyperglycemia, and depression/anxiety presenting with SOB. Patient hadbeen living home alone. Her son invited her to stay with him for the holidays;he went by to pick her up and she didn't feel well. The next 3 nights she was up all night with difficulty breathing. The next week she saw Dr. Glori Bickers (12/19) - sodium level was low at 125. CXR showed possible new mass and CT chest has confirmed suspicious for metastatic lung cancer. Patient admitted for further evaluation and treatment of acute hypoxic failure with PNA and COPD exacerbation.  Clinical Assessment and Goals of Care:   Palliative consult placed for goals of care discussions.   The patient is resting upright in bed, she is on BIPAP,she has been on BIPAP now for more than 36 hours. Patient has some skin breakdown on her nose from the BIPAP. She does appear to have mild baseline dyspnea.  Patient's family arrived at the bedside. She has 6 kids. Her husband passed away in 08-29-16. Patient's son states that patient's health has been going downhill since her husband died. She has not been keeping up with her appointments, cancelling them, refusing care. She has continued to not eat, has been losing weight.   I introduced myself and palliative care as follows: Palliative medicine is specialized medical care for people living with serious illness. It focuses on providing relief from the symptoms and stress of a serious illness. The goal is to improve quality of  life for both the patient and the family.  We discussed about the patient's current medical condition of acute hypercapneic respiratory failure, not improving on BIPAP. We discussed about imaging: CT-scan of the chest showing lung masses, bone and liver metastatic disease.  We discussed about a more comfort based approach to care,use of PRN medications for aggressive symptom management. We discussed about appropriate disposition likely being residential hospice.  All questions answered to the best of my ability, thank you for the consult,we will follow up in am.   NEXT OF KIN  has 6 kids, husband died in 08/29/2016.  SUMMARY OF RECOMMENDATIONS    DNR DNI Add IV Ativan PRN Continue IV Morphine PRN Discussed compassionate liberation from the BIPAP, full scope of comfort measures and transfer to residential hospice. Family has been given some time to think this over.  I will follow up with them in am on 12-23-16. Code Status/Advance Care Planning:  DNR    Symptom Management:    as above   Palliative Prophylaxis:   Bowel Regimen  Psycho-social/Spiritual:   Desire for further Chaplaincy support:no  Additional Recommendations: Education on Hospice  Prognosis:   <  2 weeks  Discharge Planning: Hospice facility likely, pending further discussions with family.      Primary Diagnoses: Present on Admission: . CAP (community acquired pneumonia) . Hyponatremia . Smoker . Acute on chronic respiratory failure with hypoxia ([place]) . Chronic obstructive pulmonary disease (COPD) ([place])   I have reviewed the medical record, interviewed the patient and family, and examined the patient. The following aspects are pertinent.  Past Medical History:  Diagnosis Date  . Allergy    allergic rhinitis  . Anxiety   . COPD (chronic obstructive pulmonary disease) ([place])   . Degenerative disc disease, lumbar   . Depression   . GERD (gastroesophageal reflux disease)   . Hyperglycemia     controlled by diet  . Hyperlipidemia   . Hypertension    Social History   Social History  . Marital status: Married    Spouse name: N/A  . Number of children: N/A  . Years of education: N/A   Social History Main Topics  . Smoking status: Current Every Day Smoker    Packs/day: 1.00    Years: 60.00    Types: Cigarettes  . Smokeless tobacco: Never Used  . Alcohol use No  . Drug use: No  . Sexual activity: Not Asked   Other Topics Concern  . None   Social History Narrative  . None   Family History  Problem Relation Age of Onset  . Cancer Mother     breast cancer  . Cancer Sister     breast cancer & lung cancer  . Breast cancer Daughter    Scheduled Meds: . arformoterol  15 mcg Nebulization BID  . aspirin EC  81 mg Oral Daily  . azithromycin  500 mg Intravenous Q24H  . budesonide (PULMICORT) nebulizer solution  0.25 mg Nebulization BID  . cefTRIAXone (ROCEPHIN)  IV  1 g Intravenous Q24H  . enoxaparin (LOVENOX) injection  40 mg Subcutaneous QHS  . famotidine  20 mg Oral BID  . ipratropium-albuterol  3 mL Nebulization Q6H  . lisinopril  10 mg Oral Daily  . methylPREDNISolone (SOLU-MEDROL) injection  60 mg Intravenous Q12H  . nicotine  21 mg Transdermal Daily  . simvastatin  40 mg Oral Daily  . venlafaxine XR  150 mg Oral Q breakfast   Continuous Infusions: . lactated ringers 75 mL/hr at 12/22/16 0600   PRN Meds:.albuterol, LORazepam, morphine injection Medications Prior to Admission:  Prior to Admission medications   Medication Sig Start Date End Date Taking? Authorizing Provider  acetaminophen (TYLENOL) 500 MG tablet Take 1,000 mg by mouth every 4 (four) hours as needed for moderate pain. OTC as directed    Yes Historical Provider, MD  albuterol (PROVENTIL HFA;VENTOLIN HFA) 108 (90 Base) MCG/ACT inhaler Inhale 2 puffs into the lungs every 4 (four) hours as needed for wheezing or shortness of breath. 09/22/16  Yes Amy E Bedsole, MD  aspirin 81 MG tablet Take 81 mg  by mouth daily.     Yes Historical Provider, MD  Docusate Calcium (STOOL SOFTENER PO) Take 1 capsule by mouth every evening.    Yes Historical Provider, MD  lisinopril (PRINIVIL,ZESTRIL) 10 MG tablet TAKE 1 TABLET BY MOUTH DAILY 12/04/16  Yes Abner Greenspan, MD  Multiple Vitamins-Minerals (ONE-A-DAY 50 PLUS PO) Take 1 tablet by mouth daily.   Yes Historical Provider, MD  ranitidine (ZANTAC) 150 MG tablet Take 1 tablet (150 mg total) by mouth 2 (two) times daily. 05/03/16  Yes Abner Greenspan, MD  simvastatin (  ZOCOR) 40 MG tablet TAKE 1 TABLET BY MOUTH EVERY DAY 11/15/16  Yes Abner Greenspan, MD  umeclidinium-vilanterol (ANORO ELLIPTA) 62.5-25 MCG/INH AEPB Inhale 1 puff into the lungs daily. 09/22/16  Yes Amy Cletis [place], MD  venlafaxine XR (EFFEXOR XR) 150 MG 24 hr capsule Take 1 capsule (150 mg total) by mouth daily with breakfast. 04/20/16  Yes Abner Greenspan, MD   No Known Allergies Review of Systems Appears weak, mild dyspnea evident.   Physical Exam Awake on BIPAP Sitting up in bed Diffuse wheeze, diminished towards bases, patient not able to take deep breaths.  S1 S2  Abdomen soft No edema  Vital Signs: BP (!) 120/35   Pulse (!) 123   Temp 97.9 F (36.6 C) (Oral)   Resp 20   Ht '5\' 5"'$  (1.651 m)   Wt 57.4 kg (126 lb 8.7 oz)   SpO2 91%   BMI 21.06 kg/m  Pain Assessment: No/denies pain   Pain Score:  (restless, morphine given for agitation)   SpO2: SpO2: 91 % O2 Device:SpO2: 91 % O2 Flow Rate: .O2 Flow Rate (L/min): 10 L/min  IO: Intake/output summary:  Intake/Output Summary (Last 24 hours) at 12/22/16 1543 Last data filed at 12/22/16 1415  Gross per 24 hour  Intake          1493.75 ml  Output              500 ml  Net           993.75 ml    LBM: Last BM Date: 12/22/16 Baseline Weight: Weight: 59.4 kg (131 lb) Most recent weight: Weight: 57.4 kg (126 lb 8.7 oz)     Palliative Assessment/Data:   Flowsheet Rows   Flowsheet Row Most Recent Value  Intake Tab  Referral  Department  Hospitalist  Unit at Time of Referral  Intermediate Care Unit  Palliative Care Primary Diagnosis  Cancer  Palliative Care Type  Return patient Palliative Care  Reason for referral  Clarify Goals of Care  Date first seen by Palliative Care  12/21/16  Clinical Assessment  Palliative Performance Scale Score  30%  Pain Max last 24 hours  5  Pain Min Last 24 hours  4  Dyspnea Max Last 24 Hours  6  Dyspnea Min Last 24 hours  4  Nausea Max Last 24 Hours  3  Nausea Min Last 24 Hours  2  Anxiety Max Last 24 Hours  5  Anxiety Min Last 24 Hours  4  Psychosocial & Spiritual Assessment  Palliative Care Outcomes  Patient/Family meeting held?  Yes  Who was at the meeting?  patient,6 kids.  Palliative Care Outcomes  Improved non-pain symptom therapy, Improved pain interventions, Changed to focus on comfort, Transitioned to hospice  Palliative Care follow-up planned  Yes, Facility      Time In:  1430 Time Out:  1545 Time Total:  75 min  Greater than 50%  of this time was spent counseling and coordinating care related to the above assessment and plan.  Signed by: Loistine Chance, MD  [phone]  Please contact Palliative Medicine Team phone at [phone] for questions and concerns.  For individual provider: See Shea Evans

## 2016-12-22 NOTE — Progress Notes (Addendum)
[facility] PROGRESS NOTE  Nancy Herrera BHA:[id number] DOB: 25-Mar-1941 DOA: 11/26/2016 PCP: [name], MD  Interim summary and HPI 75 y.o. female with medical history significant of COPD not on home O2, HTN, HLD, hyperglycemia, and depression/anxiety presenting with SOB.  Patient had been living home alone.  Her son invited her to stay with him for the holidays; he went by to pick her up and she didn't feel well.  The next 3 nights she was up all night with difficulty breathing. The next week she saw Dr. Glori Bickers (12/19) - sodium level was low at 125.  CXR showed possible new mass and CT chest has confirmed suspicious for metastatic lung cancer. Patient admitted for further evaluation and treatment of acute hypoxic failure with PNA and COPD exacerbation.   Assessment/Plan: 1-acute resp failure with hypoxia: patient doing poorly and with acute hypercapnia and hypoxia on ABG. CO2 up to 108 despite prolonged BIPAP therapy. Also developing metabolic alkalosis. -continue nebulizer therapy. -patient with severe resp acidosis  -GOC discussion with palliative care ended with decisions to transition to comfort care and hospice. If patient stable, can be transfer to residential hospice facility. Will follow palliative care assistance and rec's for EOL care. -PRN ativan and morphine to assist with transitioning off BIPAP. -will stop antibiotics and any other medication/blood draws and further ABG's testing at this time.  2-new Chest images suggesting metastatic lung cancer: -not a candidate for aggressive therapy and family aware of it  -IR examined patient and no significant pleural effusion for thoracentesis appreciated with [place] at bedside. -patient extremely weak and deconditioned and will not tolerate further extensive treatment anyway. After discussion with palliative care, plan is for transitioning to comfort care.  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) -Na up to  132 -at this time plan is to transition to comfort care; no further blood draws  4-tobacco abuse -continue nicotine patch -patient received cessation counseling earlier on this admission   5-GERD -continue Zantac IV  6-Depression -will d/c effexor; patient not tolerating PO's medications currently -transitioning to full comfort care -using ativan and morphine for anxiety/EOL agitation  7-HTN -giving inability to take PO's will stop lisinopril and use PRN hydralazine   8-HLD: -will stop statins; patient having difficulty with pills at this moment -plan is for transition to full comfort care   9-severe protein calorie malnutrition  -transitioning to full comfort care -will attempt comfort feeding  -patient not tolerating well Po's  Code Status: DNR/DNI Family Communication: son at bedside  Disposition Plan: will keep in stepdown, continue BIPAP for now with addition of morphine and slow transition to [place] and morphine drip. Will stop abx's and any other medication not intended for comfort. CT scan positive for metastatic lung cancer; poor prognosis overall. Appreciate palliative care inputs .   Consultants:  PCCM  Palliative care  IR  Procedures:  See below for x-ray reports   Antibiotics:  zithromax and rocephin 12/26  HPI/Subjective: Afebrile, somnolent and still requiring BIPAP. Patient appears to have less work of breathing, but ABG essentially worse and demonstrating that she is failing BIPAP use.  Objective: Vitals:   12/22/16 1416 12/22/16 1628  BP:    Pulse: (!) 123 (!) 107  Resp: 20 17  Temp:      Intake/Output Summary (Last 24 hours) at 12/22/16 1635 Last data filed at 12/22/16 1415  Gross per 24 hour  Intake          1493.75 ml  Output  500 ml  Net           993.75 ml   Filed Weights   12/20/16 0720 12/20/16 1057  Weight: 59.4 kg (131 lb) 57.4 kg (126 lb 8.7 oz)    Exam:   General:  Afebrile. No coughing spells. Still with  resp distress and requiring BIPAP, even slightly improved (which could be from getting tired, as ABG is essentially worse). Somnolent, even easily arouse and answering simple questions. Denies CP. Per nursing staff report unable to take PO meds.   Cardiovascular: S1 and S2, no JVD, no rubs or gallops  Respiratory: scattered exp wheezing, no crackles, positive rhonchi on both lung fields.  Abdomen: soft, NT, ND, positive BS  Musculoskeletal: no edema, normal tone  Data Reviewed: Basic Metabolic Panel:  Recent Labs Lab 12/20/16 0146 12/20/16 1431 12/21/16 0114 12/21/16 1346 12/22/16 0056  NA 126* 126* 129* 130* 132*  K 4.6 5.2* 5.0 5.1 5.0  CL 88* 85* 87* 87* 86*  CO2 29 30 34* 38* 40*  GLUCOSE 150* 131* 119* 126* 128*  BUN '11 10 12 16 18  '$ CREATININE 0.60 0.55 0.42* 0.54 0.46  CALCIUM 8.2* 8.2* 8.3* 8.4* 8.2*   Liver Function Tests:  Recent Labs Lab 11/26/2016 2020  AST 28  ALT 22  ALKPHOS 101  BILITOT 0.5  PROT 6.7  ALBUMIN 3.4*   CBC:  Recent Labs Lab 12/07/2016 1725 12/20/16 0511  WBC 9.9 12.3*  NEUTROABS 8.3* 11.3*  HGB 15.4* 13.4  HCT 46.7* 43.4  MCV 81.1 81.6  PLT 206 203   Cardiac Enzymes:  Recent Labs Lab 12/11/2016 2020  TROPONINI <0.03   BNP (last 3 results)  Recent Labs  12/21/2016 1725  BNP 171.9*    ProBNP (last 3 results)  Recent Labs  12/11/16 1250  PROBNP 315.0*    CBG: No results for input(s): GLUCAP in the last 168 hours.  Recent Results (from the past 240 hour(s))  Urine culture     Status: None   Collection Time: 12/14/16  1:24 PM  Result Value Ref Range Status   Organism ID, Bacteria   Final    Multiple organisms present,each less than 10,000 CFU/mL. These organisms,commonly found on external and internal genitalia,are considered colonizers. No further testing performed.   Culture, blood (Routine X 2) w Reflex to ID Panel     Status: None (Preliminary result)   Collection Time: 12/10/2016  5:25 PM  Result Value Ref  Range Status   Specimen Description RIGHT ANTECUBITAL  Final   Special Requests IN PEDIATRIC BOTTLE 5CC  Final   Culture   Final    NO GROWTH 2 DAYS Performed at [facility]    Report Status PENDING  Incomplete  Culture, blood (Routine X 2) w Reflex to ID Panel     Status: None (Preliminary result)   Collection Time: 12/13/2016  6:24 PM  Result Value Ref Range Status   Specimen Description BLOOD RIGHT ARM  Final   Special Requests BOTTLES DRAWN AEROBIC AND ANAEROBIC 5CC  Final   Culture   Final    NO GROWTH 2 DAYS Performed at [facility]    Report Status PENDING  Incomplete  MRSA PCR Screening     Status: None   Collection Time: 12/20/16 10:57 AM  Result Value Ref Range Status   MRSA by PCR NEGATIVE NEGATIVE Final    Comment:        The GeneXpert MRSA Assay (FDA approved for NASAL specimens  only), is one component of a comprehensive MRSA colonization surveillance program. It is not intended to diagnose MRSA infection nor to guide or monitor treatment for MRSA infections.      Studies: [place] Chest  Result Date: 12/21/2016 CLINICAL DATA:  Patient with acute respiratory failure and hypoxia who was noted to have a small right pleural effusion on a recent CT scan. She was also found to have metastatic lung cancer. Request was made for thoracentesis. EXAM: CHEST ULTRASOUND COMPARISON:  None. FINDINGS: No pleural fluid noted on either side of her chest. IMPRESSION: No pleural fluid bilaterally.  Thoracentesis deferred. Read by: Saverio Danker, PA-C Electronically Signed   By: Jacqulynn Cadet M.D.   On: 12/21/2016 16:11    Scheduled Meds: . arformoterol  15 mcg Nebulization BID  . aspirin EC  81 mg Oral Daily  . azithromycin  500 mg Intravenous Q24H  . budesonide (PULMICORT) nebulizer solution  0.25 mg Nebulization BID  . cefTRIAXone (ROCEPHIN)  IV  1 g Intravenous Q24H  . enoxaparin (LOVENOX) injection  40 mg Subcutaneous QHS  . famotidine  20 mg Oral BID  .  ipratropium-albuterol  3 mL Nebulization Q6H  . lisinopril  10 mg Oral Daily  . methylPREDNISolone (SOLU-MEDROL) injection  60 mg Intravenous Q12H  . nicotine  21 mg Transdermal Daily  . simvastatin  40 mg Oral Daily  . venlafaxine XR  150 mg Oral Q breakfast   Continuous Infusions: . lactated ringers 75 mL/hr at 12/22/16 0600    Principal Problem:   Acute on chronic respiratory failure with hypoxia (HCC) Active Problems:   Smoker   Hyponatremia   CAP (community acquired pneumonia)   Chronic obstructive pulmonary disease (COPD) ([place])    Time spent: 25 minutes    Barton Dubois  [facility] Pager [phone]. If 7PM-7AM, please contact night-coverage at [email or web address], password [facility] 12/22/2016, 4:35 PM  LOS: 3 days

## 2016-12-22 NOTE — Plan of Care (Signed)
Problem: Nutrition: Goal: Adequate nutrition will be maintained Outcome: Not Progressing bipap dependent, pt not able to take in po's

## 2016-12-22 NOTE — Progress Notes (Signed)
Initial Nutrition Assessment  DOCUMENTATION CODES:   Not applicable  INTERVENTION:  Nutrition intervention not appropriate at this time.  Will monitor for discussions regarding goals of care and provide intervention as warranted.   NUTRITION DIAGNOSIS:   Inadequate oral intake related to inability to eat as evidenced by NPO status.  GOAL:   Patient will meet greater than or equal to 90% of their needs  MONITOR:   Diet advancement, Labs, Weight trends, I & O's, Other (Comment) (Discussions re: GOC)  REASON FOR ASSESSMENT:   Malnutrition Screening Tool    ASSESSMENT:   75 y.o.femalewith medical history significant of COPD not on home O2, HTN, HLD, hyperglycemia, and depression/anxiety presenting with SOB. Patient with acute respiratory failure with hypoxia not improving on BiPAP.   Palliative Medicine has been following patient/family and discussing compassionate liberation from the BiPAP, full scope of comfort measures, and transfer to residential hospice. Prognosis <2 weeks. Patient is DNR/DNI. Family will think about decision tonight and follow-up with Palliative Medicine on 12/30.   Patient has not been eating well since the night PTA. She has lost 5 lbs (4% body weight) over 1.5 weeks, which is significant for time frame.  Labs reviewed: Sodium 132, Chloride 86, CO2 40, Glucose 128.   Medications reviewed and include: azithromycin, ceftriaxone, famotidine, methylprednisolone, LR @ 75 ml/hr.  Unable to complete Nutrition-Focused physical exam at this time out of respect for patient/family.  Patient at high risk for acute malnutrition.   Discussed with RN.   Diet Order:  Diet NPO time specified  Skin:  Reviewed, no issues  Last BM:  12/22/2016  Height:   Ht Readings from Last 1 Encounters:  12/20/16 '5\' 5"'$  (1.651 m)    Weight:   Wt Readings from Last 1 Encounters:  12/20/16 126 lb 8.7 oz (57.4 kg)    Ideal Body Weight:  56.8 kg  BMI:  Body mass  index is 21.06 kg/m.  Estimated Nutritional Needs:   Kcal:  1500-1700  Protein:  70-80 grams  Fluid:  >/= 1.5 L/day  EDUCATION NEEDS:   Education needs no appropriate at this time  Willey Blade, MS, RD, LDN Pager: [phone] After Hours Pager: [phone]

## 2016-12-23 DIAGNOSIS — J9601 Acute respiratory failure with hypoxia: Secondary | ICD-10-CM

## 2016-12-23 MED ORDER — SODIUM CHLORIDE 0.9 % IV SOLN
2.0000 mg/h | INTRAVENOUS | Status: DC
  Administered 2016-12-23: 2 mg/h via INTRAVENOUS
  Filled 2016-12-23: qty 10

## 2016-12-23 MED ORDER — MORPHINE SULFATE (PF) 4 MG/ML IV SOLN
3.0000 mg | INTRAVENOUS | Status: DC | PRN
  Filled 2016-12-23: qty 1

## 2016-12-23 MED ORDER — LORAZEPAM 2 MG/ML IJ SOLN
2.0000 mg | Freq: Two times a day (BID) | INTRAMUSCULAR | Status: DC | PRN
Start: 2016-12-23 — End: 2016-12-24

## 2016-12-23 MED ORDER — MORPHINE BOLUS VIA INFUSION
2.0000 mg | INTRAVENOUS | Status: DC | PRN
  Administered 2016-12-24 (×2): 2 mg via INTRAVENOUS
  Filled 2016-12-23: qty 2

## 2016-12-23 NOTE — Progress Notes (Signed)
Daily Progress Note   Patient Name: Nancy Herrera       Date: 12/23/2016 DOB: 1941-10-06  Age: 75 y.o. MRN#: [id number] Attending Physician: Barton Dubois, MD Primary Care Physician: [name], MD Admit Date: 12/08/2016  Reason for Consultation/Follow-up: Establishing goals of care  Life limiting illness: 75 year old lady with COPD, recent diagnosis of widely metastatic lung cancer, BiPAP dependency, shortness of breath.  Subjective:  appears tired, on BIPAP  Length of Stay: 4  Current Medications: Scheduled Meds:  . arformoterol  15 mcg Nebulization BID  . budesonide (PULMICORT) nebulizer solution  0.25 mg Nebulization BID  . famotidine (PEPCID) IV  20 mg Intravenous Q24H  . ipratropium-albuterol  3 mL Nebulization Q6H  . nicotine  21 mg Transdermal Daily    Continuous Infusions: . lactated ringers 10 mL/hr at 12/23/16 0600    PRN Meds: albuterol, hydrALAZINE, LORazepam, morphine injection  Physical Exam         Somnolent, with profound weakness S1-S2 Extremely diminished breath sounds bilaterally anteriorly and posteriorly Kyphosis of the back Abdomen soft nontender No edema  Vital Signs: BP (!) 127/43 (BP Location: Left Arm)   Pulse (!) 117   Temp 97.3 F (36.3 C) (Axillary)   Resp 19   Ht '5\' 5"'$  (1.651 m)   Wt 57.4 kg (126 lb 8.7 oz)   SpO2 97%   BMI 21.06 kg/m  SpO2: SpO2: 97 % O2 Device: O2 Device: Bi-PAP O2 Flow Rate: O2 Flow Rate (L/min): 10 L/min  Intake/output summary:  Intake/Output Summary (Last 24 hours) at 12/23/16 1002 Last data filed at 12/23/16 0600  Gross per 24 hour  Intake              702 ml  Output                0 ml  Net              702 ml   LBM: Last BM Date: 12/22/16 Baseline Weight: Weight: 59.4 kg (131 lb) Most recent  weight: Weight: 57.4 kg (126 lb 8.7 oz)       Palliative Assessment/Data:    Flowsheet Rows   Flowsheet Row Most Recent Value  Intake Tab  Referral Department  Hospitalist  Unit at Time of Referral  Intermediate Care Unit  Palliative Care  Primary Diagnosis  Cancer  Palliative Care Type  Return patient Palliative Care  Reason for referral  Non-pain Symptom, End of Life Care Assistance, Counsel Regarding Hospice  Date first seen by Palliative Care  12/21/16  Clinical Assessment  Palliative Performance Scale Score  20%  Pain Max last 24 hours  4  Pain Min Last 24 hours  3  Dyspnea Max Last 24 Hours  6  Dyspnea Min Last 24 hours  5  Nausea Max Last 24 Hours  3  Nausea Min Last 24 Hours  2  Anxiety Max Last 24 Hours  5  Anxiety Min Last 24 Hours  4  Psychosocial & Spiritual Assessment  Palliative Care Outcomes  Patient/Family meeting held?  Yes  Who was at the meeting?  patient son   Palliative Care Outcomes  Clarified goals of care  Palliative Care follow-up planned  Yes, Facility      Patient Active Problem List   Diagnosis Date Noted  . Lung cancer ([place])   . Metabolic alkalosis with respiratory acidosis   . Acute on chronic respiratory failure with hypoxia ([place]) 12/20/2016  . Chronic obstructive pulmonary disease (COPD) ([place]) 12/20/2016  . Community acquired pneumonia 11/28/2016  . Lung mass 12/14/2016  . Hyponatremia 12/12/2016  . Leukocytosis 12/12/2016  . Right hip pain 12/11/2016  . Pulmonary emphysema ([place]) 09/22/2016  . Grief reaction 08/30/2016  . Nausea without vomiting 06/02/2016  . Memory loss 05/03/2016  . Encounter for medication review 02/22/2016  . Frequent urination 02/22/2016  . Osteopenia 12/05/2015  . Fall at home 11/03/2015  . COPD exacerbation ([place]) 09/24/2015  . SOB (shortness of breath) on exertion 09/14/2015  . Routine general medical examination at a health care facility 08/27/2015  . Estrogen deficiency 08/27/2015  . Encounter for  Medicare annual wellness exam 07/17/2014  . Colon cancer screening 07/17/2014  . Pedal edema 03/09/2014  . Other screening mammogram 11/18/2012  . Immunization counseling 08/02/2012  . 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 SHOULDER PAIN, BILATERAL 12/06/2010  . ALLERGIC RHINITIS 08/23/2010  . Hyperglycemia 08/23/2010  . Hyperlipidemia 04/10/2007  . Generalized anxiety disorder 04/10/2007  . Smoker 04/10/2007  . DEPRESSION 04/10/2007  . Essential hypertension 04/10/2007  . GERD 04/10/2007  . DEGENERATIVE DISC DISEASE, LUMBOSACRAL SPINE 04/10/2007    Palliative Care Assessment & Plan   Patient Profile:    Assessment:  Acute on chronic hypoxic and hypercapnic respiratory failure Hyponatremia Community acquired pneumonia Lung cancer Chronic obstructive pulmonary disease Dyspnea Fatigue/end-of-life care  Recommendations/Plan:   Comfort measures only: Agree with discontinuation of BiPAP, agree with use of morphine, discussed with RN about assessing the need for continuous morphine infusion based on how the patient does. Agree with residential hospice. Discussed with son present at the bedside.  Goals of Care and Additional Recommendations:  Limitations on Scope of Treatment: Full Comfort Care  Code Status:    Code Status Orders        Start     Ordered   11/24/2016 2340  Do not attempt resuscitation (DNR)  Continuous    Question Answer Comment  In the event of cardiac or respiratory ARREST Do not call a "code blue"   In the event of cardiac or respiratory ARREST Do not perform Intubation, CPR, defibrillation or ACLS   In the event of cardiac or respiratory ARREST Use medication by any route, position, wound care, and other measures to relive pain and suffering. May use oxygen, suction and manual treatment of airway obstruction as  needed for comfort.      12/20/2016 2339    Code Status History    Date Active Date Inactive Code Status Order ID Comments User  Context   This patient has a current code status but no historical code status.    Advance Directive Documentation   Flowsheet Row Most Recent Value  Type of Advance Directive  Healthcare Power of Attorney  Pre-existing out of facility DNR order (yellow form or pink MOST form)  No data  "MOST" Form in Place?  No data       Prognosis:   Hours - Days  Discharge Planning:  Hospice facility  Care plan was discussed with  Patient's son.   Thank you for allowing the Palliative Medicine Team to assist in the care of this patient.   Time In:  8 Time Out: 8.25 Total Time 25 Prolonged Time Billed  no       Greater than 50%  of this time was spent counseling and coordinating care related to the above assessment and plan.  Loistine Chance, MD [phone]  Please contact Palliative Medicine Team phone at [phone] for questions and concerns.

## 2016-12-23 NOTE — Progress Notes (Signed)
[facility] PROGRESS NOTE  Nancy Herrera OBS:[id number] DOB: [birth year] DOA: 12/16/2016 PCP: [name], MD  Interim summary and HPI 75 y.o. female with medical history significant of COPD not on home O2, HTN, HLD, hyperglycemia, and depression/anxiety presenting with SOB.  Patient had been living home alone.  Her son invited her to stay with him for the holidays; he went by to pick her up and she didn't feel well.  The next 3 nights she was up all night with difficulty breathing. The next week she saw Dr. Glori Bickers (12/19) - sodium level was low at 125.  CXR showed possible new mass and CT chest has confirmed suspicious for metastatic lung cancer. Patient admitted for further evaluation and treatment of acute hypoxic failure with PNA and COPD exacerbation.   Assessment/Plan: 1-acute resp failure with hypoxia: patient doing poorly and with acute hypercapnia and hypoxia on ABG.  -CO2 up to 108 despite prolonged BIPAP therapy.  -continue nebulizer therapy. -patient with severe resp acidosis and also compensatory metabolic alkalosis  -GOC discussion with palliative care ended with decisions to transition to comfort care and hospice. If patient stable, can be transfer to residential hospice facility. Will follow palliative care assistance and rec's for EOL care. -PRN ativan and morphine to assist with comfort measurements and will titrate as needed  -will stop antibiotics and any other medication/blood draws and further ABG's testing at this time. -SW consulted and patient will be weaned off BIPAP and move to [address] [place].  2-new Chest images suggesting metastatic lung cancer: -not a candidate for aggressive therapy and family aware of it  -IR examined patient and no significant pleural effusion for thoracentesis appreciated with [place] at bedside. -patient extremely weak and deconditioned and will not tolerate further extensive treatment anyway. -After discussion with palliative care, plan is for  transitioning to full comfort care.  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) -Last Na up to 132 -at this time plan is to transition to comfort care; no further blood draws  4-tobacco abuse -continue nicotine patch -patient received cessation counseling earlier on this admission   5-GERD -continue Zantac IV  6-Depression -will d/c effexor; patient not tolerating PO's medications currently -transitioning to full comfort care -using ativan and morphine for anxiety/EOL agitation  7-HTN -giving inability to take PO's will stop lisinopril and use PRN hydralazine   8-HLD: -will stop statins; patient having difficulty with pills at this moment -plan is for transition to full comfort care   9-severe protein calorie malnutrition  -transitioning to full comfort care -will attempt comfort feeding  -patient not tolerating well Po's  Code Status: DNR/DNI Family Communication: sons at bedside  Disposition Plan: will keep weaned her off BIPAP and move to [address]; continue morphine and ativan for comfort measures. SW consulted for residential hospice placement.   Consultants:  PCCM  Palliative care  IR  Procedures:  See below for x-ray reports   Antibiotics:  zithromax and rocephin 12/26>>>>12/29  HPI/Subjective: Afebrile, somnolent and not interacting much. Able to follow some commands.   Objective: Vitals:   12/23/16 0500 12/23/16 0600  BP:    Pulse: (!) 107 (!) 115  Resp: 17 16  Temp:      Intake/Output Summary (Last 24 hours) at 12/23/16 0900 Last data filed at 12/23/16 0600  Gross per 24 hour  Intake             1052 ml  Output  0 ml  Net             1052 ml   Filed Weights   12/20/16 0720 12/20/16 1057  Weight: 59.4 kg (131 lb) 57.4 kg (126 lb 8.7 oz)    Exam:   General:  Afebrile. No coughing spells. Patient is somnolent/unable to interact much. Overall work of breathing is less severe (which could be from  getting tired) and patient doesn't appear in major distress.    Cardiovascular: S1 and S2, no JVD, no rubs or gallops  Respiratory: scattered exp wheezing, no crackles, positive rhonchi on both lung fields.  Abdomen: soft, NT, ND, positive BS  Musculoskeletal: no edema, normal tone  Data Reviewed: Basic Metabolic Panel:  Recent Labs Lab 12/20/16 0146 12/20/16 1431 12/21/16 0114 12/21/16 1346 12/22/16 0056  NA 126* 126* 129* 130* 132*  K 4.6 5.2* 5.0 5.1 5.0  CL 88* 85* 87* 87* 86*  CO2 29 30 34* 38* 40*  GLUCOSE 150* 131* 119* 126* 128*  BUN '11 10 12 16 18  '$ CREATININE 0.60 0.55 0.42* 0.54 0.46  CALCIUM 8.2* 8.2* 8.3* 8.4* 8.2*   Liver Function Tests:  Recent Labs Lab 12/13/2016 2020  AST 28  ALT 22  ALKPHOS 101  BILITOT 0.5  PROT 6.7  ALBUMIN 3.4*   CBC:  Recent Labs Lab 12/06/2016 1725 12/20/16 0511  WBC 9.9 12.3*  NEUTROABS 8.3* 11.3*  HGB 15.4* 13.4  HCT 46.7* 43.4  MCV 81.1 81.6  PLT 206 203   Cardiac Enzymes:  Recent Labs Lab 12/15/2016 2020  TROPONINI <0.03   BNP (last 3 results)  Recent Labs  12/15/2016 1725  BNP 171.9*    ProBNP (last 3 results)  Recent Labs  12/11/16 1250  PROBNP 315.0*    CBG: No results for input(s): GLUCAP in the last 168 hours.  Recent Results (from the past 240 hour(s))  Urine culture     Status: None   Collection Time: 12/14/16  1:24 PM  Result Value Ref Range Status   Organism ID, Bacteria   Final    Multiple organisms present,each less than 10,000 CFU/mL. These organisms,commonly found on external and internal genitalia,are considered colonizers. No further testing performed.   Culture, blood (Routine X 2) w Reflex to ID Panel     Status: None (Preliminary result)   Collection Time: 12/18/2016  5:25 PM  Result Value Ref Range Status   Specimen Description RIGHT ANTECUBITAL  Final   Special Requests IN PEDIATRIC BOTTLE 5CC  Final   Culture   Final    NO GROWTH 2 DAYS Performed at [facility]    Report Status PENDING  Incomplete  Culture, blood (Routine X 2) w Reflex to ID Panel     Status: None (Preliminary result)   Collection Time: 12/20/2016  6:24 PM  Result Value Ref Range Status   Specimen Description BLOOD RIGHT ARM  Final   Special Requests BOTTLES DRAWN AEROBIC AND ANAEROBIC 5CC  Final   Culture   Final    NO GROWTH 2 DAYS Performed at [facility]    Report Status PENDING  Incomplete  MRSA PCR Screening     Status: None   Collection Time: 12/20/16 10:57 AM  Result Value Ref Range Status   MRSA by PCR NEGATIVE NEGATIVE Final    Comment:        The GeneXpert MRSA Assay (FDA approved for NASAL specimens only), is one component of a comprehensive MRSA colonization surveillance program.  It is not intended to diagnose MRSA infection nor to guide or monitor treatment for MRSA infections.      Studies: [place] Chest  Result Date: 12/21/2016 CLINICAL DATA:  Patient with acute respiratory failure and hypoxia who was noted to have a small right pleural effusion on a recent CT scan. She was also found to have metastatic lung cancer. Request was made for thoracentesis. EXAM: CHEST ULTRASOUND COMPARISON:  None. FINDINGS: No pleural fluid noted on either side of her chest. IMPRESSION: No pleural fluid bilaterally.  Thoracentesis deferred. Read by: Saverio Danker, PA-C Electronically Signed   By: Jacqulynn Cadet M.D.   On: 12/21/2016 16:11    Scheduled Meds: . arformoterol  15 mcg Nebulization BID  . budesonide (PULMICORT) nebulizer solution  0.25 mg Nebulization BID  . famotidine (PEPCID) IV  20 mg Intravenous Q24H  . ipratropium-albuterol  3 mL Nebulization Q6H  . nicotine  21 mg Transdermal Daily   Continuous Infusions: . lactated ringers 10 mL/hr at 12/23/16 0600    Principal Problem:   Acute on chronic respiratory failure with hypoxia (HCC) Active Problems:   Smoker   Hyponatremia   Community acquired pneumonia   Chronic obstructive pulmonary  disease (COPD) ([place])   Lung cancer ([place])   Metabolic alkalosis with respiratory acidosis    Time spent: 25 minutes    Barton Dubois  [facility] Pager [phone]. If 7PM-7AM, please contact night-coverage at [email or web address], password [email or web address] 12/23/2016, 9:00 AM  LOS: 4 days

## 2016-12-24 MED ORDER — SCOPOLAMINE 1 MG/3DAYS TD PT72
1.0000 | MEDICATED_PATCH | TRANSDERMAL | Status: DC
  Administered 2016-12-24: 1.5 mg via TRANSDERMAL
  Filled 2016-12-24: qty 1

## 2016-12-25 LAB — CULTURE, BLOOD (ROUTINE X 2)
Culture: NO GROWTH
Culture: NO GROWTH

## 2016-12-25 NOTE — Progress Notes (Signed)
Verified waste of 190 ml of morphine via WIS.

## 2016-12-25 NOTE — Progress Notes (Signed)
Wasted 190 ml of morphine drip ('1mg'$ /ml) with 2nd RN.

## 2016-12-25 NOTE — Discharge Summary (Signed)
Death Summary  Nancy Herrera BRK:[id number] DOB: 05-24-1941 DOA: January 11, 2017  PCP: [name], MD PCP/Office notified: office close on [place]; notification sent through [facility] date: Jan 11, 2017 Date of Death: 01/16/17  Final Diagnoses:  Principal Problem:   Acute on chronic respiratory failure with hypoxia [facility]) Active Problems:   Smoker   Hyponatremia   Community acquired pneumonia   Chronic obstructive pulmonary disease (COPD) ([place])   Lung cancer ([place])   Metabolic alkalosis with respiratory acidosis   History of present illness:  76 y.o.femalewith medical history significant of COPD not on home O2, HTN, HLD, hyperglycemia, and depression/anxiety presenting with SOB. Patient hadbeen living home alone. Her son invited her to stay with him for the holidays;he went by to pick her up and she didn't feel well. The next 3 nights she was up all night with difficulty breathing. The next week she saw Dr. Glori Bickers (12/19) - sodium level was low at 125. CXR showed possible new mass and CT chest has confirmed suspicious for metastatic lung cancer. Patient admitted for further evaluation and treatment of acute hypoxic failure with PNA and COPD exacerbation.  Hospital Course:  Patient was admitted secondary to increase SOB and hypoxia. Found to have acute resp failure and CXR/CT scan of her chest demonstrating CAP and newly diagnosed metastatic lung cancer. Patient required BIPAP since admission due to resp failure and her ABG's demonstrated hypoxic and hypercarbic failure. Patient make very clear that she doesn't want to be intubated and didn't want any invasive work up. Family was supportive of her decisions and after 40 hours of continue treatment with BIPAP, steroids for COPD exacerbation, nebulizer treatment and IV antibiotics, she failed to improved, became even more tired and started to have trouble tolerating BIPAP mask. At that moment following decisions and further discussion  from Goals of Care meetting with Palliative care service, patient was weaned off BIPAP and initiated on morphine and ativan with intentions for full comfort measurements. Patient expired at 1:30 am on January 16, 2017, family was at bedside and grateful with treatment offered. Patient was peaceful and comfortable.   Time: 25 minutes  Signed:  Barton Dubois  [facility] 01/16/17, 8:43 AM

## 2016-12-25 NOTE — Progress Notes (Signed)
Family was at bedside time of death. MD notified, [facility] notified. Bed placement notified. Post-mortem care performed and patient shortly to be transferred to morgue.Azzie Glatter [place]

## 2016-12-25 NOTE — Progress Notes (Signed)
Patient expired at [place] am.  Family was in room at the time of death.  2nd RN Oletha Cruel, RN verified death. Patient was non-responsive with fixed pupils.  Absence of pulse and respirations.Nancy Herrera

## 2016-12-25 NOTE — Significant Event (Signed)
Patient passed at 1:30 AM.  Family at bedside at time of passing.  Death certificate completed on unit after exam confirming patient deceased.  Cause of death on certificate: Acute respiratory failure due to metastatic lung cancer and COPD.

## 2016-12-25 DEATH — deceased

## 2017-01-02 ENCOUNTER — Institutional Professional Consult (permissible substitution): Payer: Self-pay | Admitting: Internal Medicine

## 2017-02-28 ENCOUNTER — Ambulatory Visit: Payer: Self-pay | Admitting: Family Medicine

## 2017-03-27 LAB — BLOOD GAS, ARTERIAL
Acid-Base Excess: 4 mmol/L — ABNORMAL HIGH (ref 0.0–2.0)
BICARBONATE: 35.7 mmol/L — AB (ref 20.0–28.0)
Drawn by: 295031
FIO2: 50
LHR: 15 {breaths}/min
MODE: POSITIVE
O2 Saturation: 92.5 %
PEEP/CPAP: 8 cmH2O
Patient temperature: 98.6
Pressure control: 8 cmH2O
pCO2 arterial: 93.4 mmHg (ref 32.0–48.0)
pH, Arterial: 7.207 — ABNORMAL LOW (ref 7.350–7.450)
pO2, Arterial: 76.6 mmHg — ABNORMAL LOW (ref 83.0–108.0)

## 2017-10-06 IMAGING — CT CT CHEST W/ CM
2 of 3 series · 15 of 36 positions shown, 18 images · IV contrast (agent unspecified)
Comparison: None.

CLINICAL DATA: Dyspnea and difficulty breathing for 3 days.
Nonproductive cough. Evaluate for lung mass.

EXAM:
CT CHEST WITH CONTRAST
TECHNIQUE: Multidetector CT imaging of the chest was performed during
intravenous contrast administration.
CONTRAST:  75 cc of 2sovue-BKK IV

[Series 2: chest with st · axial · 0.73mm/px · z∈[-240,+36]mm · 12 of 163 slices shown, 15 images]
[im 13/163  mediastinal]
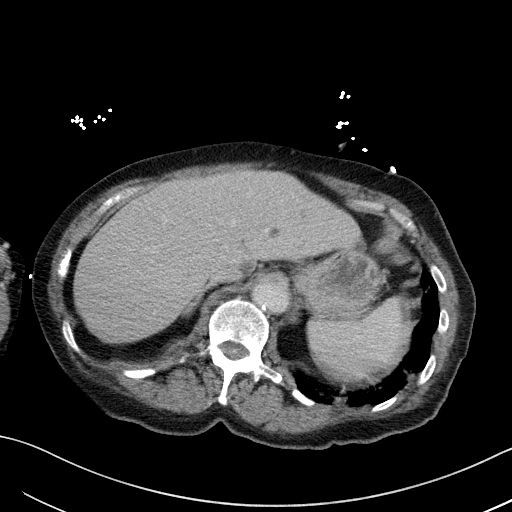
[im 13/163  lung]
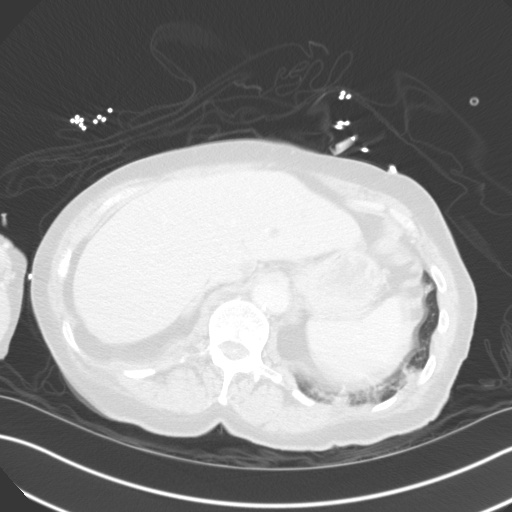
[im 25/163  lung]
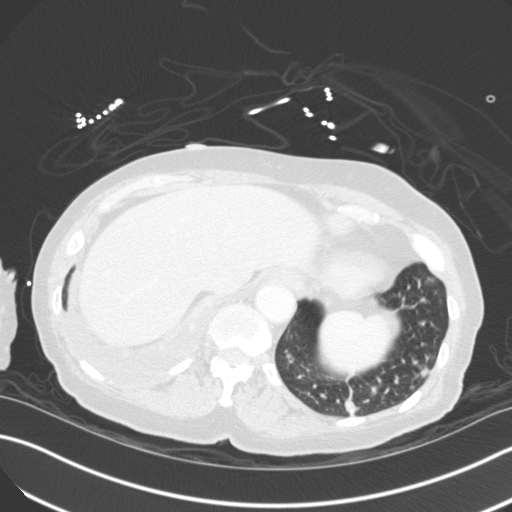
[im 37/163  lung]
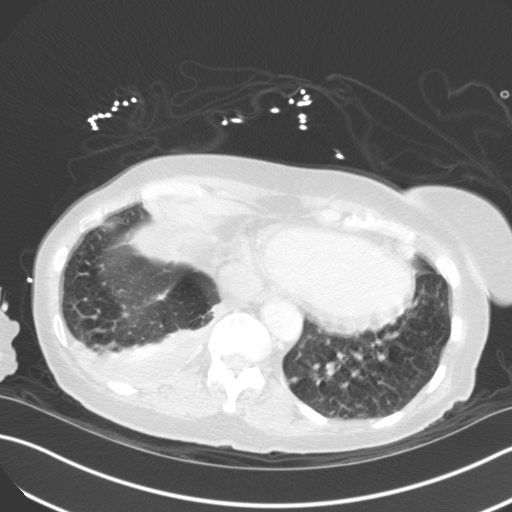
[im 49/163  lung]
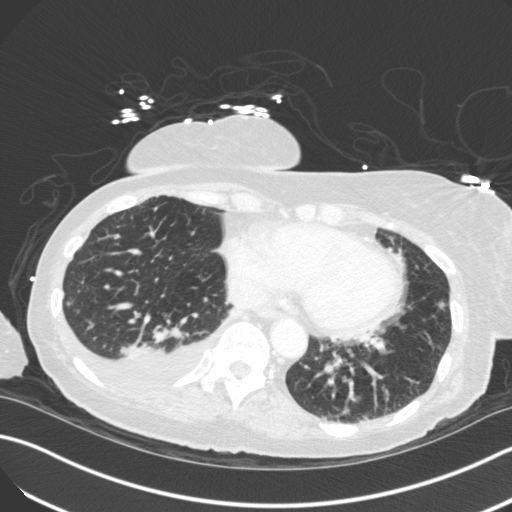
[im 61/163  mediastinal]
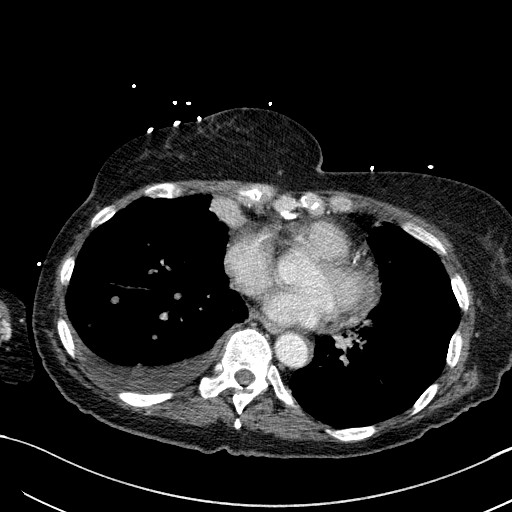
[im 61/163  lung]
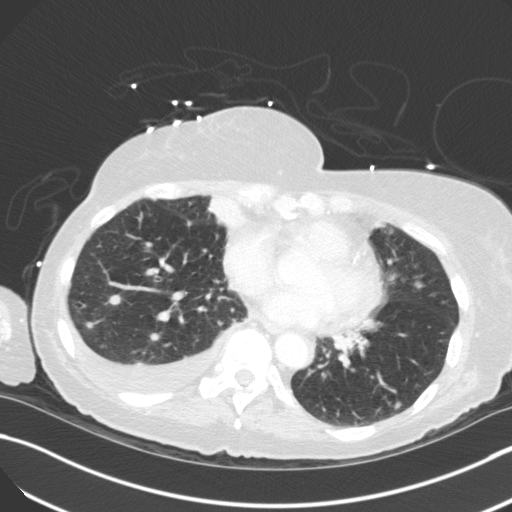
[im 73/163  lung]
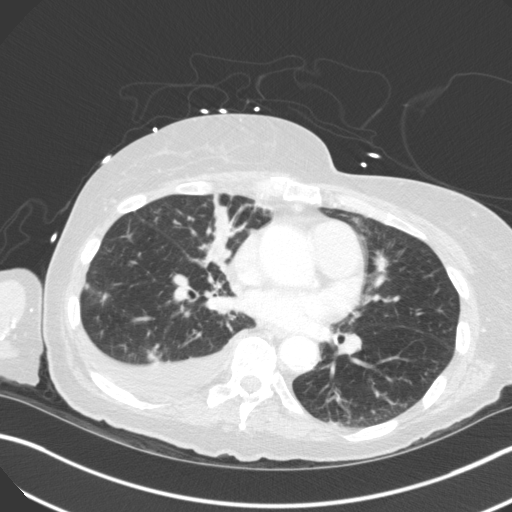
[im 91/163  lung]
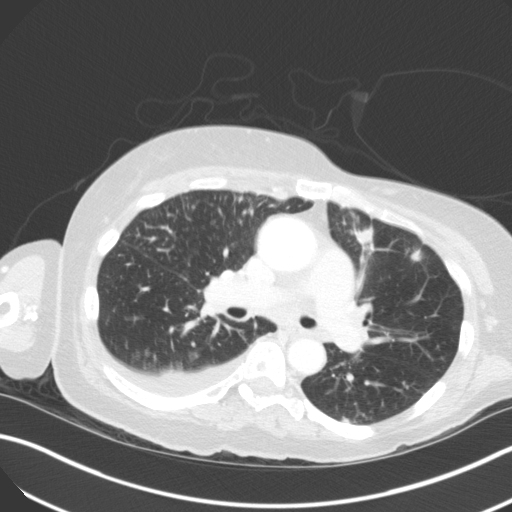
[im 103/163  lung]
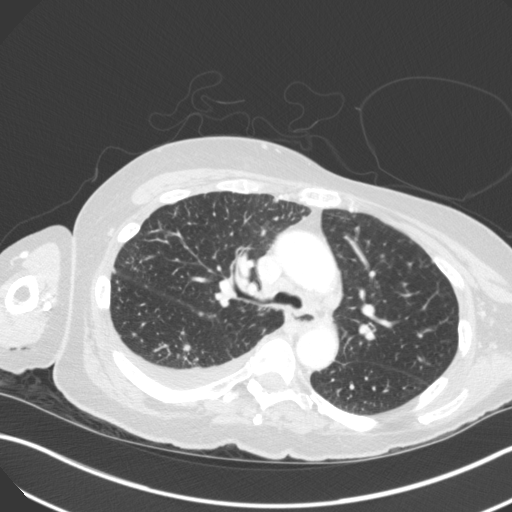
[im 115/163  mediastinal]
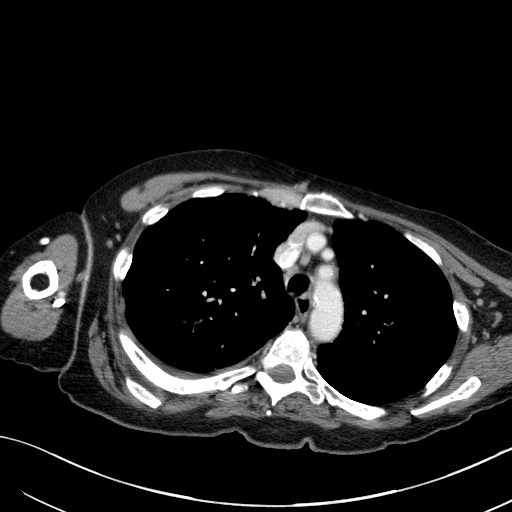
[im 115/163  lung]
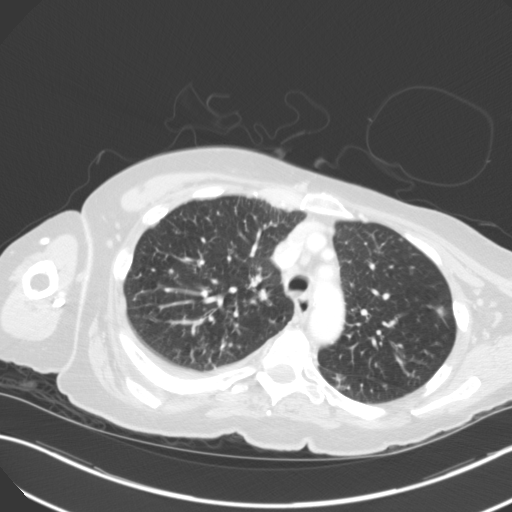
[im 127/163  lung]
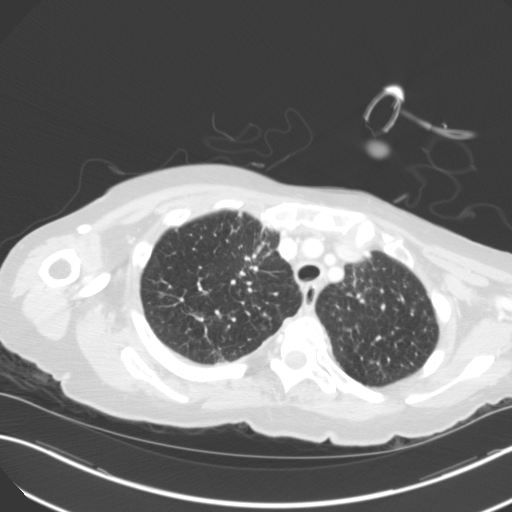
[im 139/163  lung]
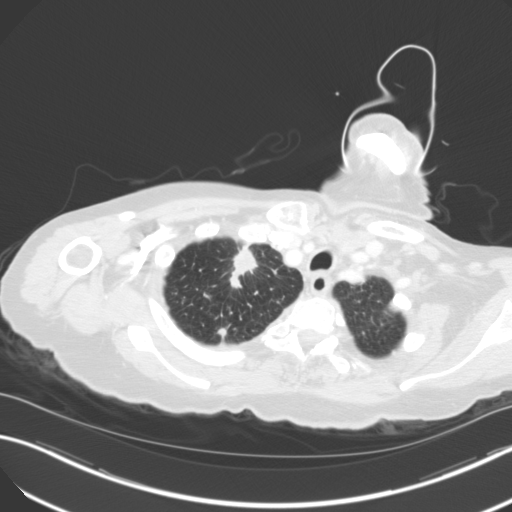
[im 151/163  lung]
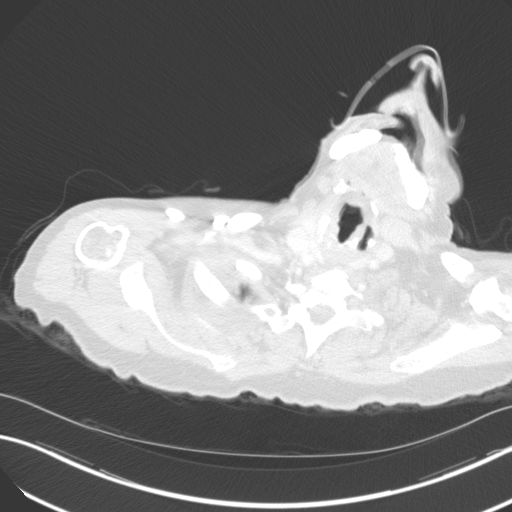

[Series 5: coronals · coronal · 0.58mm/px · 3 of 71 slices shown]
[im 15/71  lung]
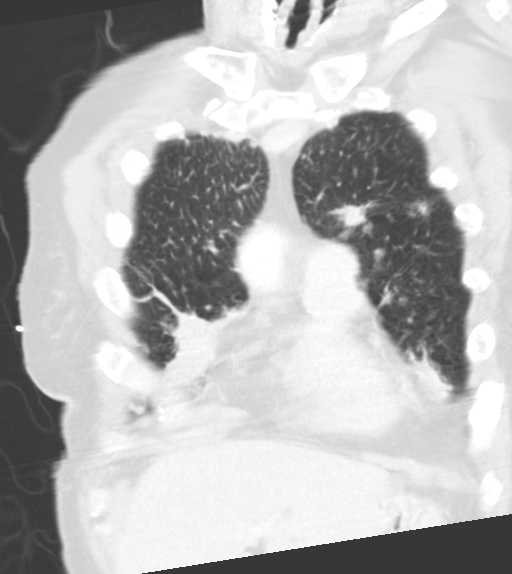
[im 29/71  lung]
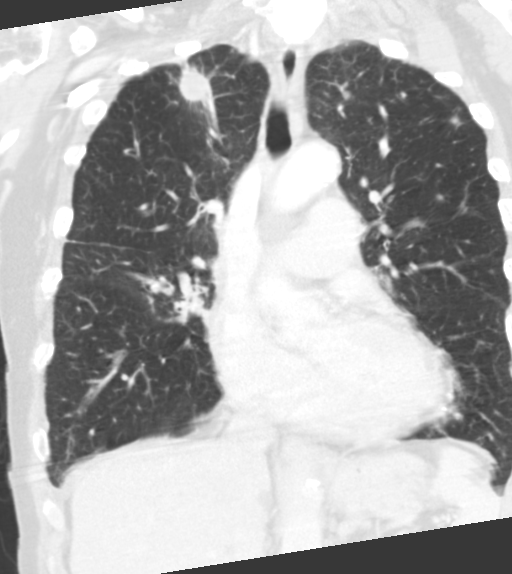
[im 43/71  lung]
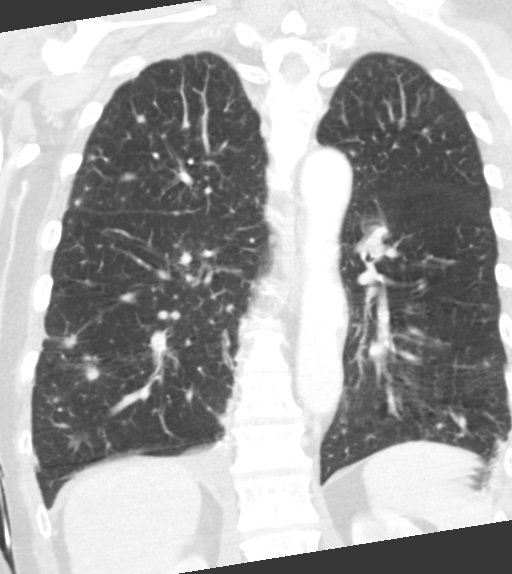

[15 of 36 positions shown; findings below may reference images not displayed]

FINDINGS: Cardiovascular: Aneurysmal dilatation of the ascending aorta to
cm at the level of the main pulmonary artery. The main pulmonary
artery is also dilated to 3.9 cm consistent with pulmonary
hypertension. The visualized cardiac chambers are normal in size.
There is coronary arteriosclerosis along the left main and LAD. No
pericardial effusion. No large central pulmonary embolus.

Mediastinum/Nodes: Subcentimeter supraclavicular, axillary and
mediastinal lymph nodes are noted the largest approximately 8 mm
short axis in the upper paratracheal portion of the mediastinum and
AP window.

Lungs/Pleura: Multiple bilateral spiculated pulmonary masses are
noted, the largest on the right is seen in the right upper lobe
anteriorly measuring 2.2 x 1.7 cm on series 4, image 26 and on the
left in the anterior left upper lobe slightly bilobed in appearance
measuring 2 x 1.3 cm, series 4, image 70. This is in the setting of
centrilobular emphysema. Small to moderate right pleural effusion.
Right middle lobe atelectasis and bronchiectasis is noted.

Upper Abdomen: Numerous 14 mm or less hypodense lesions of the liver
are identified, the largest in the caudate with fluid attenuation
consistent with a cyst. The others are too small to further
characterize and given the spiculated pulmonary masses should be
monitored to exclude hepatic metastasis. 2 cm left adrenal mass
suspicious for metastasis. Right adrenal gland is unremarkable.

Musculoskeletal: Osteoblastic metastatic disease noted of the
manubrium, T5 and T12 vertebral bodies with mixed osteolytic and
osteoblastic appearing lesion of T6 and an osteolytic T7 lesions are
noted. Additional osteolytic abnormalities of the right humeral
head, scapula, bilateral ribs and T7 lamina.
IMPRESSION: Numerous bilateral spiculated pulmonary masses with metastatic
disease to bone and possibly the liver and left adrenal gland.

Small to moderate right pleural effusion.

4.4 cm ascending aortic aneurysm with central pulmonary hypertension
characterized by engorgement of the main pulmonary artery as well.
Recommend semi-annual imaging followup by CTA or MRA and referral to
cardiothoracic surgery if not already obtained. This recommendation
follows 7787 ACCF/AHA/AATS/ACR/ASA/SCA/BELLA/FRANCIS/SOLY/ODD-MARTIN Guidelines
for the Diagnosis and Management of Patients With Thoracic Aortic
Disease. Circulation. 7787; 121: [id number]
